# Patient Record
Sex: Female | Born: 1995 | Race: Black or African American | Hispanic: No | Marital: Single | Smoking: Never smoker
Health system: Southern US, Community
[De-identification: ages and names within clinical notes are randomized; demographics above are authoritative.]

## PROBLEM LIST (undated history)

## (undated) ENCOUNTER — Inpatient Hospital Stay (HOSPITAL_COMMUNITY): Payer: Self-pay

## (undated) DIAGNOSIS — Z789 Other specified health status: Secondary | ICD-10-CM

## (undated) DIAGNOSIS — G43909 Migraine, unspecified, not intractable, without status migrainosus: Secondary | ICD-10-CM

## (undated) HISTORY — PX: UMBILICAL HERNIA REPAIR: SHX196

## (undated) HISTORY — PX: HERNIA REPAIR: SHX51

---
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
## 2015-02-04 ENCOUNTER — Encounter (HOSPITAL_BASED_OUTPATIENT_CLINIC_OR_DEPARTMENT_OTHER): Payer: Self-pay | Admitting: Emergency Medicine

## 2015-02-04 ENCOUNTER — Emergency Department (HOSPITAL_BASED_OUTPATIENT_CLINIC_OR_DEPARTMENT_OTHER)
Admission: EM | Admit: 2015-02-04 | Discharge: 2015-02-04 | Disposition: A | Discharge status: Home / Self Care | Payer: 59 | Attending: Emergency Medicine | Admitting: Emergency Medicine

## 2015-02-04 ENCOUNTER — Emergency Department (HOSPITAL_BASED_OUTPATIENT_CLINIC_OR_DEPARTMENT_OTHER): Payer: 59

## 2015-02-04 DIAGNOSIS — N8320 Unspecified ovarian cysts: Secondary | ICD-10-CM | POA: Diagnosis not present

## 2015-02-04 DIAGNOSIS — K529 Noninfective gastroenteritis and colitis, unspecified: Secondary | ICD-10-CM | POA: Diagnosis not present

## 2015-02-04 DIAGNOSIS — Z3202 Encounter for pregnancy test, result negative: Secondary | ICD-10-CM | POA: Insufficient documentation

## 2015-02-04 DIAGNOSIS — R1084 Generalized abdominal pain: Secondary | ICD-10-CM | POA: Diagnosis present

## 2015-02-04 DIAGNOSIS — R109 Unspecified abdominal pain: Secondary | ICD-10-CM

## 2015-02-04 DIAGNOSIS — N83209 Unspecified ovarian cyst, unspecified side: Secondary | ICD-10-CM

## 2015-02-04 LAB — CBC WITH DIFFERENTIAL/PLATELET
Basophils Absolute: 0 10*3/uL (ref 0.0–0.1)
Basophils Relative: 0 % (ref 0–1)
Eosinophils Absolute: 0.1 10*3/uL (ref 0.0–0.7)
Eosinophils Relative: 1 % (ref 0–5)
HCT: 40 % (ref 36.0–46.0)
Hemoglobin: 12.9 g/dL (ref 12.0–15.0)
Lymphocytes Relative: 7 % — ABNORMAL LOW (ref 12–46)
Lymphs Abs: 0.7 10*3/uL (ref 0.7–4.0)
MCH: 27.3 pg (ref 26.0–34.0)
MCHC: 32.3 g/dL (ref 30.0–36.0)
MCV: 84.7 fL (ref 78.0–100.0)
Monocytes Absolute: 0.7 10*3/uL (ref 0.1–1.0)
Monocytes Relative: 6 % (ref 3–12)
Neutro Abs: 9 10*3/uL — ABNORMAL HIGH (ref 1.7–7.7)
Neutrophils Relative %: 86 % — ABNORMAL HIGH (ref 43–77)
Platelets: 345 10*3/uL (ref 150–400)
RBC: 4.72 MIL/uL (ref 3.87–5.11)
RDW: 14.2 % (ref 11.5–15.5)
WBC: 10.5 10*3/uL (ref 4.0–10.5)

## 2015-02-04 LAB — COMPREHENSIVE METABOLIC PANEL
ALT: 30 U/L (ref 14–54)
ANION GAP: 11 (ref 5–15)
AST: 40 U/L (ref 15–41)
Albumin: 4.5 g/dL (ref 3.5–5.0)
Alkaline Phosphatase: 73 U/L (ref 38–126)
BILIRUBIN TOTAL: 0.6 mg/dL (ref 0.3–1.2)
BUN: 12 mg/dL (ref 6–20)
CO2: 21 mmol/L — ABNORMAL LOW (ref 22–32)
Calcium: 9.5 mg/dL (ref 8.9–10.3)
Chloride: 105 mmol/L (ref 101–111)
Creatinine, Ser: 0.64 mg/dL (ref 0.44–1.00)
GFR calc Af Amer: >60 mL/min (ref >60)
GFR calc non Af Amer: >60 mL/min (ref >60)
Glucose, Bld: 97 mg/dL (ref 65–99)
Potassium: 3.6 mmol/L (ref 3.5–5.1)
Sodium: 137 mmol/L (ref 135–145)
Total Protein: 8.5 g/dL — ABNORMAL HIGH (ref 6.5–8.1)

## 2015-02-04 LAB — LIPASE, BLOOD: Lipase: 29 U/L (ref 22–51)

## 2015-02-04 LAB — URINALYSIS, ROUTINE W REFLEX MICROSCOPIC
GLUCOSE, UA: NEGATIVE mg/dL
Hgb urine dipstick: NEGATIVE
Ketones, ur: 15 mg/dL — AB
Nitrite: NEGATIVE
Protein, ur: NEGATIVE mg/dL
Specific Gravity, Urine: 1.029 (ref 1.005–1.030)
Urobilinogen, UA: 1 mg/dL (ref 0.0–1.0)
pH: 5.5 (ref 5.0–8.0)

## 2015-02-04 LAB — URINE MICROSCOPIC-ADD ON

## 2015-02-04 LAB — PREGNANCY, URINE: Preg Test, Ur: NEGATIVE

## 2015-02-04 MED ORDER — ONDANSETRON HCL 4 MG/2ML IJ SOLN
4.0000 mg | Freq: Once | INTRAMUSCULAR | Status: DC
  Filled 2015-02-04: qty 2

## 2015-02-04 MED ORDER — ONDANSETRON HCL 4 MG/2ML IJ SOLN
4.0000 mg | Freq: Once | INTRAMUSCULAR | Status: AC
  Administered 2015-02-04: 4 mg via INTRAVENOUS
  Filled 2015-02-04: qty 2

## 2015-02-04 MED ORDER — GI COCKTAIL ~~LOC~~
30.0000 mL | Freq: Once | ORAL | Status: AC
  Administered 2015-02-04: 30 mL via ORAL
  Filled 2015-02-04: qty 30

## 2015-02-04 MED ORDER — SODIUM CHLORIDE 0.9 % IV BOLUS (SEPSIS)
1000.0000 mL | Freq: Once | INTRAVENOUS | Status: AC
  Administered 2015-02-04: 1000 mL via INTRAVENOUS

## 2015-02-04 MED ORDER — ONDANSETRON HCL 4 MG PO TABS: 4.0000 mg | ORAL_TABLET | Freq: Three times a day (TID) | ORAL | Status: DC | PRN

## 2015-02-04 MED ORDER — MORPHINE SULFATE 4 MG/ML IJ SOLN
4.0000 mg | Freq: Once | INTRAMUSCULAR | Status: AC
  Administered 2015-02-04: 4 mg via INTRAVENOUS
  Filled 2015-02-04: qty 1

## 2015-02-04 MED ORDER — IOHEXOL 300 MG/ML  SOLN
25.0000 mL | Freq: Once | INTRAMUSCULAR | Status: AC | PRN
  Administered 2015-02-04: 25 mL via ORAL

## 2015-02-04 MED ORDER — IOHEXOL 300 MG/ML  SOLN
100.0000 mL | Freq: Once | INTRAMUSCULAR | Status: AC | PRN
  Administered 2015-02-04: 100 mL via INTRAVENOUS

## 2015-02-04 NOTE — ED Provider Notes (Signed)
CSN: [id number]     Arrival date & time 02/04/15  [id number] History   First MD Initiated Contact with Patient 02/04/15 0940     Chief Complaint  Patient presents with  . Abdominal Pain     (Consider location/radiation/quality/duration/timing/severity/associated sxs/prior Treatment) Patient is a 19 y.o. female presenting with abdominal pain.  Abdominal Pain Pain location:  Generalized Pain quality: aching   Pain radiates to:  Does not radiate Pain severity:  Severe Onset quality:  Gradual Duration:  4 days Timing:  Constant Progression:  Worsening Context: not sick contacts   Relieved by:  Nothing Worsened by:  Palpation Associated symptoms: diarrhea, nausea and vomiting   Associated symptoms: no dysuria, no fever and no hematuria     No past medical history on file. Past Surgical History  Procedure Laterality Date  . Umbilical hernia repair     Family History  Problem Relation Age of Onset  . Heart attack Father   . Cancer Father     stomach  . Cancer Other     lymphoma  . Diabetes Other    History  Substance Use Topics  . Smoking status: Never Smoker   . Smokeless tobacco: Never Used  . Alcohol Use: No   OB History    No data available     Review of Systems  Constitutional: Negative for fever.  Gastrointestinal: Positive for nausea, vomiting, abdominal pain and diarrhea.  Genitourinary: Negative for dysuria and hematuria.  All other systems reviewed and are negative.     Allergies  Review of patient's allergies indicates no known allergies.  Home Medications   Prior to Admission medications   Not on File   BP 112/70 mmHg  Pulse 85  Temp(Src) 98.6 F (37 C) (Oral)  Resp 16  Ht 5' 8.5" (1.74 m)  Wt 250 lb (113.399 kg)  BMI 37.46 kg/m2  SpO2 100%  LMP 01/19/2015 Physical Exam  Constitutional: She is oriented to person, place, and time. She appears well-developed and well-nourished. No distress.  HENT:  Head: Normocephalic and atraumatic.   Mouth/Throat: Oropharynx is clear and moist.  Eyes: Conjunctivae are normal. Pupils are equal, round, and reactive to light. No scleral icterus.  Neck: Neck supple.  Cardiovascular: Normal rate, regular rhythm, normal heart sounds and intact distal pulses.   No murmur heard. Pulmonary/Chest: Effort normal and breath sounds normal. No stridor. No respiratory distress. She has no rales.  Abdominal: Soft. Bowel sounds are normal. She exhibits no distension. There is generalized tenderness (worst LUQ). There is no rigidity, no rebound, no guarding and no CVA tenderness.  Musculoskeletal: Normal range of motion.  Neurological: She is alert and oriented to person, place, and time.  Skin: Skin is warm and dry. No rash noted.  Psychiatric: She has a normal mood and affect. Her behavior is normal.  Nursing note and vitals reviewed.   ED Course  Procedures (including critical care time) Labs Review Labs Reviewed  URINALYSIS, ROUTINE W REFLEX MICROSCOPIC - Abnormal; Notable for the following:    APPearance CLOUDY (*)    Bilirubin Urine SMALL (*)    Ketones, ur 15 (*)    Leukocytes, UA SMALL (*)    All other components within normal limits  URINE MICROSCOPIC-ADD ON - Abnormal; Notable for the following:    Squamous Epithelial / LPF FEW (*)    Bacteria, UA MANY (*)    All other components within normal limits  CBC WITH DIFFERENTIAL/PLATELET - Abnormal; Notable for the following:  Neutrophils Relative % 86 (*)    Neutro Abs 9.0 (*)    Lymphocytes Relative 7 (*)    All other components within normal limits  COMPREHENSIVE METABOLIC PANEL - Abnormal; Notable for the following:    CO2 21 (*)    Total Protein 8.5 (*)    All other components within normal limits  URINE CULTURE  PREGNANCY, URINE  LIPASE, BLOOD    Imaging Review Ct Abdomen Pelvis W Contrast  02/04/2015   CLINICAL DATA:  Abdominal pain, nausea and vomiting starting Friday  EXAM: CT ABDOMEN AND PELVIS WITH CONTRAST   TECHNIQUE: Multidetector CT imaging of the abdomen and pelvis was performed using the standard protocol following bolus administration of intravenous contrast.  CONTRAST:  25mL OMNIPAQUE IOHEXOL 300 MG/ML SOLN, 100mL OMNIPAQUE IOHEXOL 300 MG/ML SOLN  COMPARISON:  None.  FINDINGS: The lung bases are unremarkable. Sagittal images of the spine are unremarkable. Mild hepatic fatty infiltration. No focal hepatic mass. Enhanced pancreas, spleen and adrenal glands are unremarkable. Abdominal aorta is unremarkable. Enhanced kidneys are symmetrical in size. No hydronephrosis or hydroureter. Some liquid stool and gas noted in right colon and transverse colon. Diarrhea cannot be excluded. Normal retrocecal appendix. The terminal ileum is unremarkable. Mild fluid distended distal small bowel loops suspicious for enteritis. There is no transition point in small bowel caliber. No air-fluid levels. No evidence of small bowel obstruction.  Some stool noted in descending colon. Colonic gas noted in collapsed sigmoid colon. The uterus is unremarkable. A hemorrhagic follicle is noted within left ovary measures 1.4 cm. Small free fluid noted within posterior cul-de-sac. The urinary bladder is unremarkable. No destructive bony lesions are noted within pelvis. No inguinal adenopathy.  IMPRESSION: 1. There is some liquid stool and gas in right colon and transverse colon. Diarrhea cannot be excluded. Mild fluid distended distal small bowel loops suspicious for enteritis. No evidence of small bowel obstruction. No small bowel air-fluid levels. 2. Normal appendix.  No pericecal inflammation. 3. No hydronephrosis or hydroureter. 4. There is a hemorrhagic follicle within left ovary measures 1.4 cm. Small amount of free fluid noted within posterior pelvis.   Electronically Signed   By: Natasha MeadLiviu  Pop M.D.   On: 02/04/2015 11:03     EKG Interpretation None      MDM   Final diagnoses:  Abdominal pain  Enteritis  Hemorrhagic ovarian cyst     19 yo female with vomiting, diarrhea, and left sided abdominal pain.  Well appearing, nontoxic.  CT shows evidence of enteritis and of left ovarian hemorrhagic follicle.  I suspect symptoms are related to both.  Plan dc with zofran, advised fluids and supportive treatment.        Blake DivineJohn Maveryk Renstrom, MD 02/04/15 [phone]

## 2015-02-04 NOTE — Discharge Instructions (Signed)
Abdominal Pain, Women °Abdominal (stomach, pelvic, or belly) pain can be caused by many things. It is important to tell your doctor: °· The location of the pain. °· Does it come and go or is it present all the time? °· Are there things that start the pain (eating certain foods, exercise)? °· Are there other symptoms associated with the pain (fever, nausea, vomiting, diarrhea)? °All of this is helpful to know when trying to find the cause of the pain. °CAUSES  °· Stomach: virus or bacteria infection, or ulcer. °· Intestine: appendicitis (inflamed appendix), regional ileitis (Crohn's disease), ulcerative colitis (inflamed colon), irritable bowel syndrome, diverticulitis (inflamed diverticulum of the colon), or cancer of the stomach or intestine. °· Gallbladder disease or stones in the gallbladder. °· Kidney disease, kidney stones, or infection. °· Pancreas infection or cancer. °· Fibromyalgia (pain disorder). °· Diseases of the female organs: °¨ Uterus: fibroid (non-cancerous) tumors or infection. °¨ Fallopian tubes: infection or tubal pregnancy. °¨ Ovary: cysts or tumors. °¨ Pelvic adhesions (scar tissue). °¨ Endometriosis (uterus lining tissue growing in the pelvis and on the pelvic organs). °¨ Pelvic congestion syndrome (female organs filling up with blood just before the menstrual period). °¨ Pain with the menstrual period. °¨ Pain with ovulation (producing an egg). °¨ Pain with an IUD (intrauterine device, birth control) in the uterus. °¨ Cancer of the female organs. °· Functional pain (pain not caused by a disease, may improve without treatment). °· Psychological pain. °· Depression. °DIAGNOSIS  °Your doctor will decide the seriousness of your pain by doing an examination. °· Blood tests. °· X-rays. °· Ultrasound. °· CT scan (computed tomography, special type of X-ray). °· MRI (magnetic resonance imaging). °· Cultures, for infection. °· Barium enema (dye inserted in the large intestine, to better view it with  X-rays). °· Colonoscopy (looking in intestine with a lighted tube). °· Laparoscopy (minor surgery, looking in abdomen with a lighted tube). °· Major abdominal exploratory surgery (looking in abdomen with a large incision). °TREATMENT  °The treatment will depend on the cause of the pain.  °· Many cases can be observed and treated at home. °· Over-the-counter medicines recommended by your caregiver. °· Prescription medicine. °· Antibiotics, for infection. °· Birth control pills, for painful periods or for ovulation pain. °· Hormone treatment, for endometriosis. °· Nerve blocking injections. °· Physical therapy. °· Antidepressants. °· Counseling with a psychologist or psychiatrist. °· Minor or major surgery. °HOME CARE INSTRUCTIONS  °· Do not take laxatives, unless directed by your caregiver. °· Take over-the-counter pain medicine only if ordered by your caregiver. Do not take aspirin because it can cause an upset stomach or bleeding. °· Try a clear liquid diet (broth or water) as ordered by your caregiver. Slowly move to a bland diet, as tolerated, if the pain is related to the stomach or intestine. °· Have a thermometer and take your temperature several times a day, and record it. °· Bed rest and sleep, if it helps the pain. °· Avoid sexual intercourse, if it causes pain. °· Avoid stressful situations. °· Keep your follow-up appointments and tests, as your caregiver orders. °· If the pain does not go away with medicine or surgery, you may try: °¨ Acupuncture. °¨ Relaxation exercises (yoga, meditation). °¨ Group therapy. °¨ Counseling. °SEEK MEDICAL CARE IF:  °· You notice certain foods cause stomach pain. °· Your home care treatment is not helping your pain. °· You need stronger pain medicine. °· You want your IUD removed. °· You feel faint or   lightheaded. °· You develop nausea and vomiting. °· You develop a rash. °· You are having side effects or an allergy to your medicine. °SEEK IMMEDIATE MEDICAL CARE IF:  °· Your  pain does not go away or gets worse. °· You have a fever. °· Your pain is felt only in portions of the abdomen. The right side could possibly be appendicitis. The left lower portion of the abdomen could be colitis or diverticulitis. °· You are passing blood in your stools (bright red or black tarry stools, with or without vomiting). °· You have blood in your urine. °· You develop chills, with or without a fever. °· You pass out. °MAKE SURE YOU:  °· Understand these instructions. °· Will watch your condition. °· Will get help right away if you are not doing well or get worse. °Document Released: 07/04/2007 Document Revised: 01/21/2014 Document Reviewed: 07/24/2009 °[employer]® Patient Information ©2015 [employer]. This information is not intended to replace advice given to you by your health care provider. Make sure you discuss any questions you have with your health care provider. °Viral Gastroenteritis °Viral gastroenteritis is also known as stomach flu. This condition affects the stomach and intestinal tract. It can cause sudden diarrhea and vomiting. The illness typically lasts 3 to 8 days. Most people develop an immune response that eventually gets rid of the virus. While this natural response develops, the virus can make you quite ill. °CAUSES  °Many different viruses can cause gastroenteritis, such as rotavirus or noroviruses. You can catch one of these viruses by consuming contaminated food or water. You may also catch a virus by sharing utensils or other personal items with an infected person or by touching a contaminated surface. °SYMPTOMS  °The most common symptoms are diarrhea and vomiting. These problems can cause a severe loss of body fluids (dehydration) and a body salt (electrolyte) imbalance. Other symptoms may include: °· Fever. °· Headache. °· Fatigue. °· Abdominal pain. °DIAGNOSIS  °Your caregiver can usually diagnose viral gastroenteritis based on your symptoms and a physical exam. A stool  sample may also be taken to test for the presence of viruses or other infections. °TREATMENT  °This illness typically goes away on its own. Treatments are aimed at rehydration. The most serious cases of viral gastroenteritis involve vomiting so severely that you are not able to keep fluids down. In these cases, fluids must be given through an intravenous line (IV). °HOME CARE INSTRUCTIONS  °· Drink enough fluids to keep your urine clear or pale yellow. Drink small amounts of fluids frequently and increase the amounts as tolerated. °· Ask your caregiver for specific rehydration instructions. °· Avoid: °¨ Foods high in sugar. °¨ Alcohol. °¨ Carbonated drinks. °¨ Tobacco. °¨ Juice. °¨ Caffeine drinks. °¨ Extremely hot or cold fluids. °¨ Fatty, greasy foods. °¨ Too much intake of anything at one time. °¨ Dairy products until 24 to 48 hours after diarrhea stops. °· You may consume probiotics. Probiotics are active cultures of beneficial bacteria. They may lessen the amount and number of diarrheal stools in adults. Probiotics can be found in yogurt with active cultures and in supplements. °· Wash your hands well to avoid spreading the virus. °· Only take over-the-counter or prescription medicines for pain, discomfort, or fever as directed by your caregiver. Do not give aspirin to children. Antidiarrheal medicines are not recommended. °· Ask your caregiver if you should continue to take your regular prescribed and over-the-counter medicines. °· Keep all follow-up appointments as directed by your caregiver. °  SEEK IMMEDIATE MEDICAL CARE IF:  °· You are unable to keep fluids down. °· You do not urinate at least once every 6 to 8 hours. °· You develop shortness of breath. °· You notice blood in your stool or vomit. This may look like coffee grounds. °· You have abdominal pain that increases or is concentrated in one small area (localized). °· You have persistent vomiting or diarrhea. °· You have a fever. °· The patient is a  child younger than 3 months, and he or she has a fever. °· The patient is a child older than 3 months, and he or she has a fever and persistent symptoms. °· The patient is a child older than 3 months, and he or she has a fever and symptoms suddenly get worse. °· The patient is a baby, and he or she has no tears when crying. °MAKE SURE YOU:  °· Understand these instructions. °· Will watch your condition. °· Will get help right away if you are not doing well or get worse. °Document Released: 09/06/2005 Document Revised: 11/29/2011 Document Reviewed: 06/23/2011 °ExitCare® Patient Information ©2015 [employer]. This information is not intended to replace advice given to you by your health care provider. Make sure you discuss any questions you have with your health care provider. ° °

## 2015-02-04 NOTE — ED Notes (Signed)
Abdominal pain with N/V/D since Friday.  No fever.  No recent exposure to illness.

## 2015-02-04 NOTE — ED Notes (Addendum)
Pt c/o not being able to breathe. Pt lungs clear and throat clear. Pt hyperventilating. Instructed to slow breathing down. Sat 100%. Pt wants something to drink. Pt given a few ice chips. Pt calmed down. Pt refused zofran.

## 2015-02-04 NOTE — ED Notes (Signed)
Family x 2 in to sit with pt.

## 2015-02-05 LAB — URINE CULTURE

## 2016-02-04 ENCOUNTER — Telehealth (HOSPITAL_COMMUNITY): Payer: Self-pay | Admitting: Psychiatry

## 2016-02-04 NOTE — Telephone Encounter (Signed)
Records request approved

## 2017-07-26 ENCOUNTER — Other Ambulatory Visit: Payer: Self-pay

## 2017-07-26 ENCOUNTER — Encounter (HOSPITAL_BASED_OUTPATIENT_CLINIC_OR_DEPARTMENT_OTHER): Payer: Self-pay | Admitting: *Deleted

## 2017-07-26 ENCOUNTER — Emergency Department (HOSPITAL_BASED_OUTPATIENT_CLINIC_OR_DEPARTMENT_OTHER)
Admission: EM | Admit: 2017-07-26 | Discharge: 2017-07-26 | Disposition: A | Discharge status: Home / Self Care | Payer: 59 | Attending: Emergency Medicine | Admitting: Emergency Medicine

## 2017-07-26 DIAGNOSIS — R070 Pain in throat: Secondary | ICD-10-CM | POA: Diagnosis present

## 2017-07-26 DIAGNOSIS — J020 Streptococcal pharyngitis: Secondary | ICD-10-CM | POA: Diagnosis not present

## 2017-07-26 LAB — RAPID STREP SCREEN (MED CTR MEBANE ONLY): Streptococcus, Group A Screen (Direct): POSITIVE — AB

## 2017-07-26 MED ORDER — ACETAMINOPHEN 325 MG PO TABS
650.0000 mg | ORAL_TABLET | Freq: Once | ORAL | Status: AC
  Administered 2017-07-26: 650 mg via ORAL
  Filled 2017-07-26: qty 2

## 2017-07-26 MED ORDER — DEXAMETHASONE 10 MG/ML FOR PEDIATRIC ORAL USE
10.0000 mg | Freq: Once | INTRAMUSCULAR | Status: AC
  Administered 2017-07-26: 10 mg via ORAL
  Filled 2017-07-26: qty 1

## 2017-07-26 MED ORDER — AMOXICILLIN 500 MG PO CAPS: 500.0000 mg | ORAL_CAPSULE | Freq: Two times a day (BID) | ORAL | 0 refills | Status: DC

## 2017-07-26 NOTE — Discharge Instructions (Signed)
You have been diagnosed with strep throat. Use the amoxicillin  for 10 full days. It is very important that  you or your child complete the entire course of this medication or the strep may not completely be treated.  Also discard  you or your child's toothbrush and begin using a new one in 3 days. For sore throat, take ibuprofen or tylenol every 6hr as needed. Follow up with your doctor in 2-3 days if no improvement. Return to the ED sooner for worsening condition, inability to swallow, breathing difficulty, new concerns. ° ° °

## 2017-07-26 NOTE — ED Notes (Signed)
Pt verbalizes understanding of d/c instructions and denies any further needs at this time. 

## 2017-07-26 NOTE — ED Triage Notes (Signed)
Sore throat today.

## 2017-07-27 NOTE — ED Provider Notes (Signed)
[facility] EMERGENCY DEPARTMENT Provider Note   CSN: [id number] Arrival date & time: 07/26/17  2204     History   Chief Complaint Chief Complaint  Patient presents with  . Sore Throat    HPI [name] is a 21 y.o. female.  HPI 21 year old African-American female presents to the ED for evaluation of sore throat.  Sore throat started this morning.  Hurts to swallow.  Denies any associated rhinorrhea, otalgia, cough, fevers, chills.  Denies any known sick contacts.  Has tried nothing for pain prior to arrival.  Nothing makes better. History reviewed. No pertinent past medical history.  There are no active problems to display for this patient.   Past Surgical History:  Procedure Laterality Date  . UMBILICAL HERNIA REPAIR      OB History    No data available       Home Medications    Prior to Admission medications   Medication Sig Start Date End Date Taking? Authorizing Provider  amoxicillin (AMOXIL) 500 MG capsule Take 1 capsule (500 mg total) 2 (two) times daily by mouth. 07/26/17   Rise MuLeaphart, Amiere Cawley T, PA-C  ondansetron (ZOFRAN) 4 MG tablet Take 1 tablet (4 mg total) by mouth every 8 (eight) hours as needed for nausea or vomiting. 02/04/15   Blake DivineWofford, John, MD    Family History Family History  Problem Relation Age of Onset  . Heart attack Father   . Cancer Father        stomach  . Cancer Other        lymphoma  . Diabetes Other     Social History Social History   Tobacco Use  . Smoking status: Never Smoker  . Smokeless tobacco: Never Used  Substance Use Topics  . Alcohol use: No  . Drug use: No     Allergies   Patient has no known allergies.   Review of Systems Review of Systems  Constitutional: Negative for chills and fever.  HENT: Positive for sore throat and trouble swallowing. Negative for congestion and rhinorrhea.   Respiratory: Negative for cough.   Gastrointestinal: Negative for vomiting.     Physical Exam Updated  Vital Signs BP 121/68   Pulse 84   Temp 98.6 F (37 C) (Oral)   Resp 18   Ht 5\' 8"  (1.727 m)   Wt 84.8 kg (187 lb)   LMP 07/10/2017   SpO2 100%   BMI 28.43 kg/m   Physical Exam  Constitutional: She appears well-developed and well-nourished. No distress.  HENT:  Head: Normocephalic and atraumatic.  Right Ear: Hearing, tympanic membrane and ear canal normal.  Left Ear: Hearing, tympanic membrane and ear canal normal.  Mouth/Throat: Uvula is midline and mucous membranes are normal. No uvula swelling. Oropharyngeal exudate, posterior oropharyngeal edema and posterior oropharyngeal erythema present. No tonsillar abscesses. Tonsils are 1+ on the right. Tonsils are 1+ on the left. Tonsillar exudate.  Eyes: Right eye exhibits no discharge. Left eye exhibits no discharge. No scleral icterus.  Neck: Normal range of motion.  Pulmonary/Chest: Effort normal. No stridor. No respiratory distress. She has no wheezes. She has no rhonchi. She has no rales. She exhibits no tenderness.  Musculoskeletal: Normal range of motion.  Neurological: She is alert.  Skin: Skin is warm and dry. Capillary refill takes less than 2 seconds. No pallor.  Psychiatric: Her behavior is normal. Judgment and thought content normal.  Nursing note and vitals reviewed.    ED Treatments / Results  Labs (all  labs ordered are listed, but only abnormal results are displayed) Labs Reviewed  RAPID STREP SCREEN (NOT AT [facility]) - Abnormal; Notable for the following components:      Result Value   Streptococcus, Group A Screen (Direct) POSITIVE (*)    All other components within normal limits    EKG  EKG Interpretation None       Radiology No results found.  Procedures Procedures (including critical care time)  Medications Ordered in ED Medications  acetaminophen (TYLENOL) tablet 650 mg (650 mg Oral Given 07/26/17 2253)  dexamethasone (DECADRON) 10 MG/ML injection for Pediatric ORAL use 10 mg (10 mg Oral Given  07/26/17 2253)     Initial Impression / Assessment and Plan / ED Course  I have reviewed the triage vital signs and the nursing notes.  Pertinent labs & imaging results that were available during my care of the patient were reviewed by me and considered in my medical decision making (see chart for details).     Pt febrile with tonsillar exudate, cervical lymphadenopathy, & dysphagia; diagnosis of strep. Treated in the Ed with steroids, NSAIDs, Pain medication and amox.  Pt appears mildly dehydrated, discussed importance of water rehydration. Presentation non concerning for PTA or infxn spread to soft tissue. No trismus or uvula deviation. Specific return precautions discussed. Pt able to drink water in ED without difficulty with intact air way. Recommended PCP follow up.    Final Clinical Impressions(s) / ED Diagnoses   Final diagnoses:  Strep throat    ED Discharge Orders        Ordered    amoxicillin (AMOXIL) 500 MG capsule  2 times daily     07/26/17 2303       Rise MuLeaphart, Adler Chartrand T, PA-C 07/27/17 1430    Long, Arlyss RepressJoshua G, MD 07/28/17 1407

## 2017-09-20 NOTE — L&D Delivery Note (Signed)
Delivery Note At 3:43 PM a viable female was delivered via Vaginal, Spontaneous (Presentation: vertex;  LOA).  APGAR: 8, 9; weight  .  6 lb 0.3 oz Placenta status:normal ,intact .  Cord: 3 vessel with the following complications: none.  Cord pH: not sent  Anesthesia:  epidural Episiotomy: None Lacerations: 1st degree;Vaginal Suture Repair: none Est. Blood Loss (mL): 300  Mom to postpartum.  Baby to NICU.  Scheryl Darter 05/21/2018, 4:02 PM

## 2017-12-23 LAB — OB RESULTS CONSOLE HEPATITIS B SURFACE ANTIGEN: Hepatitis B Surface Ag: NEGATIVE

## 2017-12-23 LAB — OB RESULTS CONSOLE RPR: RPR: NONREACTIVE

## 2017-12-23 LAB — OB RESULTS CONSOLE PLATELET COUNT: Platelets: 370

## 2017-12-23 LAB — OB RESULTS CONSOLE HGB/HCT, BLOOD
HCT: 37
Hemoglobin: 12.2

## 2017-12-23 LAB — OB RESULTS CONSOLE ABO/RH: RH Type: NEGATIVE

## 2017-12-23 LAB — OB RESULTS CONSOLE RUBELLA ANTIBODY, IGM: RUBELLA: IMMUNE

## 2017-12-23 LAB — HIV ANTIBODY (ROUTINE TESTING W REFLEX): HIV Screen 4th Generation wRfx: NONREACTIVE

## 2017-12-23 LAB — OB RESULTS CONSOLE ANTIBODY SCREEN: Antibody Screen: NEGATIVE

## 2018-01-30 ENCOUNTER — Inpatient Hospital Stay (HOSPITAL_COMMUNITY)
Admission: AD | Admit: 2018-01-30 | Discharge: 2018-01-31 | Disposition: A | Discharge status: Home / Self Care | Payer: 59 | Source: Ambulatory Visit | Attending: Obstetrics and Gynecology | Admitting: Obstetrics and Gynecology

## 2018-01-30 ENCOUNTER — Encounter (HOSPITAL_COMMUNITY): Payer: Self-pay | Admitting: *Deleted

## 2018-01-30 ENCOUNTER — Inpatient Hospital Stay (HOSPITAL_COMMUNITY): Payer: 59

## 2018-01-30 DIAGNOSIS — O26892 Other specified pregnancy related conditions, second trimester: Secondary | ICD-10-CM | POA: Insufficient documentation

## 2018-01-30 DIAGNOSIS — B373 Candidiasis of vulva and vagina: Secondary | ICD-10-CM

## 2018-01-30 DIAGNOSIS — Z3A18 18 weeks gestation of pregnancy: Secondary | ICD-10-CM

## 2018-01-30 DIAGNOSIS — R109 Unspecified abdominal pain: Secondary | ICD-10-CM | POA: Diagnosis not present

## 2018-01-30 DIAGNOSIS — R102 Pelvic and perineal pain: Secondary | ICD-10-CM | POA: Diagnosis present

## 2018-01-30 DIAGNOSIS — Z79899 Other long term (current) drug therapy: Secondary | ICD-10-CM | POA: Insufficient documentation

## 2018-01-30 DIAGNOSIS — O26899 Other specified pregnancy related conditions, unspecified trimester: Secondary | ICD-10-CM

## 2018-01-30 DIAGNOSIS — N949 Unspecified condition associated with female genital organs and menstrual cycle: Secondary | ICD-10-CM

## 2018-01-30 DIAGNOSIS — R748 Abnormal levels of other serum enzymes: Secondary | ICD-10-CM | POA: Diagnosis not present

## 2018-01-30 DIAGNOSIS — O98812 Other maternal infectious and parasitic diseases complicating pregnancy, second trimester: Secondary | ICD-10-CM | POA: Diagnosis not present

## 2018-01-30 DIAGNOSIS — B3731 Acute candidiasis of vulva and vagina: Secondary | ICD-10-CM

## 2018-01-30 LAB — CBC
HEMATOCRIT: 36 % (ref 36.0–46.0)
Hemoglobin: 11.7 g/dL — ABNORMAL LOW (ref 12.0–15.0)
MCH: 27.7 pg (ref 26.0–34.0)
MCHC: 32.5 g/dL (ref 30.0–36.0)
MCV: 85.1 fL (ref 78.0–100.0)
PLATELETS: 322 10*3/uL (ref 150–400)
RBC: 4.23 MIL/uL (ref 3.87–5.11)
RDW: 13.8 % (ref 11.5–15.5)
WBC: 7.5 10*3/uL (ref 4.0–10.5)

## 2018-01-30 LAB — URINALYSIS, ROUTINE W REFLEX MICROSCOPIC
Bilirubin Urine: NEGATIVE
Glucose, UA: NEGATIVE mg/dL
Hgb urine dipstick: NEGATIVE
Ketones, ur: NEGATIVE mg/dL
Nitrite: NEGATIVE
Protein, ur: NEGATIVE mg/dL
Specific Gravity, Urine: 1.02 (ref 1.005–1.030)
pH: 6 (ref 5.0–8.0)

## 2018-01-30 LAB — COMPREHENSIVE METABOLIC PANEL
ALT: 57 U/L — ABNORMAL HIGH (ref 14–54)
AST: 47 U/L — ABNORMAL HIGH (ref 15–41)
Albumin: 3.4 g/dL — ABNORMAL LOW (ref 3.5–5.0)
Alkaline Phosphatase: 57 U/L (ref 38–126)
Anion gap: 9 (ref 5–15)
BILIRUBIN TOTAL: 0.2 mg/dL — AB (ref 0.3–1.2)
BUN: 9 mg/dL (ref 6–20)
CHLORIDE: 105 mmol/L (ref 101–111)
CO2: 22 mmol/L (ref 22–32)
Calcium: 9 mg/dL (ref 8.9–10.3)
Creatinine, Ser: 0.54 mg/dL (ref 0.44–1.00)
GFR calc non Af Amer: >60 mL/min (ref >60)
Glucose, Bld: 88 mg/dL (ref 65–99)
POTASSIUM: 3.9 mmol/L (ref 3.5–5.1)
Sodium: 136 mmol/L (ref 135–145)
Total Protein: 7.8 g/dL (ref 6.5–8.1)

## 2018-01-30 LAB — WET PREP, GENITAL
Clue Cells Wet Prep HPF POC: NONE SEEN
SPERM: NONE SEEN
TRICH WET PREP: NONE SEEN

## 2018-01-30 LAB — LIPASE, BLOOD: Lipase: 31 U/L (ref 11–51)

## 2018-01-30 MED ORDER — ACETAMINOPHEN 500 MG PO TABS
1000.0000 mg | ORAL_TABLET | Freq: Four times a day (QID) | ORAL | Status: DC | PRN
  Administered 2018-01-30: 1000 mg via ORAL
  Filled 2018-01-30: qty 2

## 2018-01-30 NOTE — MAU Provider Note (Signed)
History     CSN: [id number]  Arrival date and time: 01/30/18 2128   First Provider Initiated Contact with Patient 01/30/18 2159      Chief Complaint  Patient presents with  . Abdominal Pain   G1 .2 wks here with LAP and HA. She describes as bilateral pressure from her lower abdomen to her umbilicus. Started 2 weeks ago. Nothing makes it better or worse. She has not tried any OTCs. No VB or discharge. No fevers. No urinary sx. HA started this am. Located left frontal. No visual disturbances. Hasn't tried anything for it. Eating and drinking well today. She is transferring care from [facility] to [facility]. No prenatal records are on file.    OB History    Gravida  1   Para      Term      Preterm      AB      Living        SAB      TAB      Ectopic      Multiple      Live Births              History reviewed. No pertinent past medical history.  Past Surgical History:  Procedure Laterality Date  . UMBILICAL HERNIA REPAIR      Family History  Problem Relation Age of Onset  . Heart attack Father   . Cancer Father        stomach  . Cancer Other        lymphoma  . Diabetes Other     Social History   Tobacco Use  . Smoking status: Never Smoker  . Smokeless tobacco: Never Used  Substance Use Topics  . Alcohol use: No  . Drug use: No    Allergies: No Known Allergies  Medications Prior to Admission  Medication Sig Dispense Refill Last Dose  . amoxicillin (AMOXIL) 500 MG capsule Take 1 capsule (500 mg total) 2 (two) times daily by mouth. 20 capsule 0   . ondansetron (ZOFRAN) 4 MG tablet Take 1 tablet (4 mg total) by mouth every 8 (eight) hours as needed for nausea or vomiting. 12 tablet 0     Review of Systems  Constitutional: Negative for fever.  Eyes: Negative for visual disturbance.  Gastrointestinal: Positive for abdominal pain. Negative for constipation, diarrhea, nausea and vomiting.  Genitourinary: Negative for dysuria, hematuria,  urgency, vaginal bleeding and vaginal discharge.  Neurological: Positive for headaches.   Physical Exam   Blood pressure 119/69, pulse 70, temperature 98.8 F (37.1 C), temperature source Oral, resp. rate 16, last menstrual period 07/10/2017, SpO2 99 %.  Physical Exam  Nursing note and vitals reviewed. Constitutional: She is oriented to person, place, and time. She appears well-developed and well-nourished. No distress.  HENT:  Head: Normocephalic and atraumatic.  Eyes: Pupils are equal, round, and reactive to light. Conjunctivae and EOM are normal.  Neck: Normal range of motion.  Cardiovascular: Normal rate.  Respiratory: Effort normal. No respiratory distress.  GI: Soft. She exhibits no distension and no mass. There is tenderness in the suprapubic area. There is no rebound and no guarding.  Genitourinary:  Genitourinary Comments: External: no lesions or erythema Vagina: rugated, pink, moist, small curdy white discharge Cervix closed/long   Musculoskeletal: Normal range of motion.  Neurological: She is alert and oriented to person, place, and time.  Skin: Skin is warm and dry.  Psychiatric: She has a normal mood and affect.  FHT 140  Results for orders placed or performed during the hospital encounter of 01/30/18 (from the past 24 hour(s))  Urinalysis, Routine w reflex microscopic     Status: Abnormal   Collection Time: 01/30/18  9:35 PM  Result Value Ref Range   Color, Urine YELLOW YELLOW   APPearance HAZY (A) CLEAR   Specific Gravity, Urine 1.020 1.005 - 1.030   pH 6.0 5.0 - 8.0   Glucose, UA NEGATIVE NEGATIVE mg/dL   Hgb urine dipstick NEGATIVE NEGATIVE   Bilirubin Urine NEGATIVE NEGATIVE   Ketones, ur NEGATIVE NEGATIVE mg/dL   Protein, ur NEGATIVE NEGATIVE mg/dL   Nitrite NEGATIVE NEGATIVE   Leukocytes, UA SMALL (A) NEGATIVE   RBC / HPF 0-5 0 - 5 RBC/hpf   WBC, UA 6-10 0 - 5 WBC/hpf   Bacteria, UA RARE (A) NONE SEEN   Squamous Epithelial / LPF 0-5 0 - 5   Mucus  PRESENT   Wet prep, genital     Status: Abnormal   Collection Time: 01/30/18 10:10 PM  Result Value Ref Range   Yeast Wet Prep HPF POC PRESENT (A) NONE SEEN   Trich, Wet Prep NONE SEEN NONE SEEN   Clue Cells Wet Prep HPF POC NONE SEEN NONE SEEN   WBC, Wet Prep HPF POC MANY (A) NONE SEEN   Sperm NONE SEEN   CBC     Status: Abnormal   Collection Time: 01/30/18 10:26 PM  Result Value Ref Range   WBC 7.5 4.0 - 10.5 K/uL   RBC 4.23 3.87 - 5.11 MIL/uL   Hemoglobin 11.7 (L) 12.0 - 15.0 g/dL   HCT 16.1 09.6 - 04.5 %   MCV 85.1 78.0 - 100.0 fL   MCH 27.7 26.0 - 34.0 pg   MCHC 32.5 30.0 - 36.0 g/dL   RDW 40.9 81.1 - 91.4 %   Platelets 322 150 - 400 K/uL  Comprehensive metabolic panel     Status: Abnormal   Collection Time: 01/30/18 10:26 PM  Result Value Ref Range   Sodium 136 135 - 145 mmol/L   Potassium 3.9 3.5 - 5.1 mmol/L   Chloride 105 101 - 111 mmol/L   CO2 22 22 - 32 mmol/L   Glucose, Bld 88 65 - 99 mg/dL   BUN 9 6 - 20 mg/dL   Creatinine, Ser 7.82 0.44 - 1.00 mg/dL   Calcium 9.0 8.9 - 95.6 mg/dL   Total Protein 7.8 6.5 - 8.1 g/dL   Albumin 3.4 (L) 3.5 - 5.0 g/dL   AST 47 (H) 15 - 41 U/L   ALT 57 (H) 14 - 54 U/L   Alkaline Phosphatase 57 38 - 126 U/L   Total Bilirubin 0.2 (L) 0.3 - 1.2 mg/dL   GFR calc non Af Amer >60 >60 mL/min   GFR calc Af Amer >60 >60 mL/min   Anion gap 9 5 - 15  Lipase, blood     Status: None   Collection Time: 01/30/18 10:26 PM  Result Value Ref Range   Lipase 31 11 - 51 U/L   US Abdomen Limited Ruq  Result Date: 01/31/2018 CLINICAL DATA:  Abdominal pain, [redacted] weeks pregnant EXAM: ULTRASOUND ABDOMEN LIMITED RIGHT UPPER QUADRANT COMPARISON:  None. FINDINGS: Gallbladder: No gallstones or wall thickening visualized. No sonographic Murphy sign noted by sonographer. Gallbladder slightly contracted. Common bile duct: Diameter: 3.5 mm Liver: No focal lesion identified. Within normal limits in parenchymal echogenicity. Portal vein is patent on color Doppler  imaging with normal direction  of blood flow towards the liver. IMPRESSION: Contracted gallbladder. Otherwise negative right upper quadrant abdominal ultrasound Electronically Signed   By: Jasmine Pang M.D.   On: 01/31/2018 00:15   MAU Course  Procedures Tylenol  MDM Labs ordered and reviewed. No UTI or threatened SAB. Elevated LE>RUQ [place] ordered and nml. Pain somewhat improved. Likely MSK or RL pain. Discussed comfort measures. Stable for discharge home.   Assessment and Plan   1. [redacted] weeks gestation of pregnancy   2. Abdominal pain in pregnancy   3. Yeast vaginitis   4. Elevated liver enzymes   5. Round ligament pain    Discharge home Follow up at [facility] next week as scheduled Tylenol prn Heat prn comfort  Allergies as of 01/31/2018   No Known Allergies     Medication List    STOP taking these medications   amoxicillin 500 MG capsule Commonly known as:  AMOXIL     TAKE these medications   ondansetron 4 MG tablet Commonly known as:  ZOFRAN Take 1 tablet (4 mg total) by mouth every 8 (eight) hours as needed for nausea or vomiting.      Donette Larry, CNM 01/31/2018, 12:27 AM

## 2018-01-30 NOTE — MAU Note (Signed)
Pt states she has been having pressure in her lower abdomen up to her umbilicus for the last two weeks.  Nothing makes it better or worse.  No VB.  States she woke up with a headache and has had it all day has not taken anything for any of the pain.

## 2018-01-31 DIAGNOSIS — B373 Candidiasis of vulva and vagina: Secondary | ICD-10-CM

## 2018-01-31 DIAGNOSIS — R109 Unspecified abdominal pain: Secondary | ICD-10-CM

## 2018-01-31 DIAGNOSIS — Z3A18 18 weeks gestation of pregnancy: Secondary | ICD-10-CM | POA: Diagnosis not present

## 2018-01-31 DIAGNOSIS — O26892 Other specified pregnancy related conditions, second trimester: Secondary | ICD-10-CM

## 2018-01-31 LAB — GC/CHLAMYDIA PROBE AMP (~~LOC~~) NOT AT ARMC
CHLAMYDIA, DNA PROBE: NEGATIVE
Neisseria Gonorrhea: NEGATIVE

## 2018-01-31 NOTE — Discharge Instructions (Signed)
Round Ligament Pain The round ligament is a cord of muscle and tissue that helps to support the uterus. It can become a source of pain during pregnancy if it becomes stretched or twisted as the baby grows. The pain usually begins in the second trimester of pregnancy, and it can come and go until the baby is delivered. It is not a serious problem, and it does not cause harm to the baby. Round ligament pain is usually a short, sharp, and pinching pain, but it can also be a dull, lingering, and aching pain. The pain is felt in the lower side of the abdomen or in the groin. It usually starts deep in the groin and moves up to the outside of the hip area. Pain can occur with:  A sudden change in position.  Rolling over in bed.  Coughing or sneezing.  Physical activity.  Follow these instructions at home: Watch your condition for any changes. Take these steps to help with your pain:  When the pain starts, relax. Then try: ? Sitting down. ? Flexing your knees up to your abdomen. ? Lying on your side with one pillow under your abdomen and another pillow between your legs. ? Sitting in a warm bath for 15-20 minutes or until the pain goes away.  Take over-the-counter and prescription medicines only as told by your health care provider.  Move slowly when you sit and stand.  Avoid long walks if they cause pain.  Stop or lessen your physical activities if they cause pain.  Contact a health care provider if:  Your pain does not go away with treatment.  You feel pain in your back that you did not have before.  Your medicine is not helping. Get help right away if:  You develop a fever or chills.  You develop uterine contractions.  You develop vaginal bleeding.  You develop nausea or vomiting.  You develop diarrhea.  You have pain when you urinate. This information is not intended to replace advice given to you by your health care provider. Make sure you discuss any questions you have  with your health care provider. Document Released: 06/15/2008 Document Revised: 02/12/2016 Document Reviewed: 11/13/2014 Elsevier Interactive Patient Education  2018 [employer] Vaginal Yeast infection, Adult Vaginal yeast infection is a condition that causes soreness, swelling, and redness (inflammation) of the vagina. It also causes vaginal discharge. This is a common condition. Some women get this infection frequently. What are the causes? This condition is caused by a change in the normal balance of the yeast (candida) and bacteria that live in the vagina. This change causes an overgrowth of yeast, which causes the inflammation. What increases the risk? This condition is more likely to develop in:  Women who take antibiotic medicines.  Women who have diabetes.  Women who take birth control pills.  Women who are pregnant.  Women who douche often.  Women who have a weak defense (immune) system.  Women who have been taking steroid medicines for a long time.  Women who frequently wear tight clothing.  What are the signs or symptoms? Symptoms of this condition include:  White, thick vaginal discharge.  Swelling, itching, redness, and irritation of the vagina. The lips of the vagina (vulva) may be affected as well.  Pain or a burning feeling while urinating.  Pain during sex.  How is this diagnosed? This condition is diagnosed with a medical history and physical exam. This will include a pelvic exam. Your health care provider will  examine a sample of your vaginal discharge under a microscope. Your health care provider may send this sample for testing to confirm the diagnosis. °How is this treated? °This condition is treated with medicine. Medicines may be over-the-counter or prescription. You may be told to use one or more of the following: °· Medicine that is taken orally. °· Medicine that is applied as a cream. °· Medicine that is inserted directly into the vagina  (suppository). ° °Follow these instructions at home: °· Take or apply over-the-counter and prescription medicines only as told by your health care provider. °· Do not have sex until your health care provider has approved. Tell your sex partner that you have a yeast infection. That person should go to his or her health care provider if he or she develops symptoms. °· Do not wear tight clothes, such as pantyhose or tight pants. °· Avoid using tampons until your health care provider approves. °· Eat more yogurt. This may help to keep your yeast infection from returning. °· Try taking a sitz bath to help with discomfort. This is a warm water bath that is taken while you are sitting down. The water should only come up to your hips and should cover your buttocks. Do this 3-4 times per day or as told by your health care provider. °· Do not douche. °· Wear breathable, cotton underwear. °· If you have diabetes, keep your blood sugar levels under control. °Contact a health care provider if: °· You have a fever. °· Your symptoms go away and then return. °· Your symptoms do not get better with treatment. °· Your symptoms get worse. °· You have new symptoms. °· You develop blisters in or around your vagina. °· You have blood coming from your vagina and it is not your menstrual period. °· You develop pain in your abdomen. °This information is not intended to replace advice given to you by your health care provider. Make sure you discuss any questions you have with your health care provider. °Document Released: 06/16/2005 Document Revised: 02/18/2016 Document Reviewed: 03/10/2015 °Elsevier Interactive Patient Education © 2018 Elsevier Inc. ° °

## 2018-02-06 ENCOUNTER — Encounter: Payer: Self-pay | Admitting: Certified Nurse Midwife

## 2018-02-06 ENCOUNTER — Ambulatory Visit (INDEPENDENT_AMBULATORY_CARE_PROVIDER_SITE_OTHER): Payer: Medicaid Other | Admitting: Certified Nurse Midwife

## 2018-02-06 VITALS — BP 115/73 | HR 81 | Wt 231.0 lb

## 2018-02-06 DIAGNOSIS — Z3481 Encounter for supervision of other normal pregnancy, first trimester: Secondary | ICD-10-CM | POA: Diagnosis not present

## 2018-02-06 DIAGNOSIS — Z3402 Encounter for supervision of normal first pregnancy, second trimester: Secondary | ICD-10-CM

## 2018-02-06 DIAGNOSIS — Z8719 Personal history of other diseases of the digestive system: Secondary | ICD-10-CM | POA: Insufficient documentation

## 2018-02-06 DIAGNOSIS — Z3400 Encounter for supervision of normal first pregnancy, unspecified trimester: Secondary | ICD-10-CM | POA: Insufficient documentation

## 2018-02-06 DIAGNOSIS — Z9889 Other specified postprocedural states: Secondary | ICD-10-CM

## 2018-02-06 DIAGNOSIS — O26892 Other specified pregnancy related conditions, second trimester: Secondary | ICD-10-CM

## 2018-02-06 DIAGNOSIS — O26899 Other specified pregnancy related conditions, unspecified trimester: Secondary | ICD-10-CM

## 2018-02-06 DIAGNOSIS — Z6791 Unspecified blood type, Rh negative: Secondary | ICD-10-CM | POA: Insufficient documentation

## 2018-02-06 MED ORDER — PRENATE PIXIE 10-0.6-0.4-200 MG PO CAPS: 1.0000 | ORAL_CAPSULE | Freq: Every day | ORAL | 12 refills | Status: DC

## 2018-02-06 NOTE — Progress Notes (Signed)
Subjective:   [name] is a 22 y.o. G1P0 at [redacted]w[redacted]d by LMP being seen today for her first obstetrical visit.  Her obstetrical history is significant for none. Patient does intend to breast feed. Pregnancy history fully reviewed.  Patient reports no complaints.  HISTORY: OB History  Gravida Para Term Preterm AB Living  1 0 0 0 0 0  SAB TAB Ectopic Multiple Live Births  0 0 0 0 0    # Outcome Date GA Lbr Len/2nd Weight Sex Delivery Anes PTL Lv  1 Current             Last pap smear was done 11/2017 and was normal  History reviewed. No pertinent past medical history. Past Surgical History:  Procedure Laterality Date  . UMBILICAL HERNIA REPAIR     Family History  Problem Relation Age of Onset  . Heart attack Father   . Cancer Father        stomach  . Cancer Other        lymphoma  . Diabetes Other    Social History   Tobacco Use  . Smoking status: Never Smoker  . Smokeless tobacco: Never Used  Substance Use Topics  . Alcohol use: No  . Drug use: No   No Known Allergies Current Outpatient Medications on File Prior to Visit  Medication Sig Dispense Refill  . Prenatal Vit-Fe Fumarate-FA (MULTIVITAMIN-PRENATAL) 27-0.8 MG TABS tablet Take 1 tablet by mouth daily at 12 noon.    . ondansetron (ZOFRAN) 4 MG tablet Take 1 tablet (4 mg total) by mouth every 8 (eight) hours as needed for nausea or vomiting. 12 tablet 0   No current facility-administered medications on file prior to visit.     Review of Systems Pertinent items noted in HPI and remainder of comprehensive ROS otherwise negative.  Exam   Vitals:   02/06/18 1343  BP: 115/73  Pulse: 81  Weight: 231 lb (104.8 kg)   Fetal Heart Rate (bpm): 140; doppler  Uterus:  Fundal Height: 19 cm  Pelvic Exam: Perineum: deferred   Vulva: deferred   Vagina:  deferred   Cervix: deferred   Adnexa: deferred   Bony Pelvis: average  System: General: well-developed, well-nourished female in no acute distress   Breast:  normal appearance, no masses or tenderness   Skin: normal coloration and turgor, no rashes   Neurologic: oriented, normal, negative, normal mood   Extremities: normal strength, tone, and muscle mass, ROM of all joints is normal   HEENT PERRLA, extraocular movement intact and sclera clear, anicteric   Mouth/Teeth mucous membranes moist, pharynx normal without lesions and dental hygiene good   Neck supple and no masses   Cardiovascular: regular rate and rhythm   Respiratory:  no respiratory distress, normal breath sounds   Abdomen: soft, non-tender; bowel sounds normal; no masses,  no organomegaly     Assessment:   Pregnancy: G1P0 Patient Active Problem List   Diagnosis Date Noted  . Supervision of normal first pregnancy, antepartum 02/06/2018  . History of umbilical hernia repair 02/06/2018  . Rh negative state in antepartum period 02/06/2018     Plan:  1. Supervision of normal first pregnancy, antepartum     Doing well.  Transfer with records from [facility].   - Hemoglobin A1c - Inheritest Core(CF97,SMA,FraX) - [place] MFM OB COMP + 14 WK; Future - Enroll Patient in Babyscripts - Prenat-FeAsp-Meth-FA-DHA w/o A (PRENATE PIXIE) 10-0.6-0.4-200 MG CAPS; Take 1 tablet by  mouth daily.  Dispense: 30 capsule; Refill: 12  2. History of umbilical hernia repair     As a child  3. Rh negative state in antepartum period    Rhogam planned for 28 weeks.    Initial labs drawn. Continue prenatal vitamins. Genetic Screening discussed, Quad screen and NIPS: results reviewed. Ultrasound discussed; fetal anatomic survey: ordered. Problem list reviewed and updated. The nature of  - [facility] with multiple MDs and other Advanced Practice Providers was explained to patient; also emphasized that residents, students are part of our team. Routine obstetric precautions reviewed. Return in about 1 month (around 03/06/2018) for ROB.     Orvilla Cornwall, CNM [facility], [facility]

## 2018-02-07 LAB — HEMOGLOBIN A1C
Est. average glucose Bld gHb Est-mCnc: 100 mg/dL
Hgb A1c MFr Bld: 5.1 % (ref 4.8–5.6)

## 2018-02-14 ENCOUNTER — Encounter (HOSPITAL_COMMUNITY): Payer: Self-pay

## 2018-02-17 LAB — INHERITEST CORE(CF97,SMA,FRAX)

## 2018-02-20 ENCOUNTER — Other Ambulatory Visit: Payer: Self-pay | Admitting: Certified Nurse Midwife

## 2018-02-20 ENCOUNTER — Ambulatory Visit (HOSPITAL_COMMUNITY)
Admission: RE | Admit: 2018-02-20 | Discharge: 2018-02-20 | Disposition: A | Discharge status: Home / Self Care | Payer: 59 | Source: Ambulatory Visit | Attending: Certified Nurse Midwife | Admitting: Certified Nurse Midwife

## 2018-02-20 DIAGNOSIS — Z363 Encounter for antenatal screening for malformations: Secondary | ICD-10-CM | POA: Diagnosis not present

## 2018-02-20 DIAGNOSIS — Z3402 Encounter for supervision of normal first pregnancy, second trimester: Secondary | ICD-10-CM | POA: Diagnosis not present

## 2018-02-20 DIAGNOSIS — Z3A21 21 weeks gestation of pregnancy: Secondary | ICD-10-CM | POA: Diagnosis not present

## 2018-02-20 DIAGNOSIS — O99212 Obesity complicating pregnancy, second trimester: Secondary | ICD-10-CM

## 2018-02-20 DIAGNOSIS — Z3400 Encounter for supervision of normal first pregnancy, unspecified trimester: Secondary | ICD-10-CM

## 2018-02-21 ENCOUNTER — Other Ambulatory Visit: Payer: Self-pay | Admitting: Certified Nurse Midwife

## 2018-02-21 DIAGNOSIS — O99212 Obesity complicating pregnancy, second trimester: Secondary | ICD-10-CM | POA: Insufficient documentation

## 2018-02-21 DIAGNOSIS — Z3400 Encounter for supervision of normal first pregnancy, unspecified trimester: Secondary | ICD-10-CM

## 2018-02-21 DIAGNOSIS — Z363 Encounter for antenatal screening for malformations: Secondary | ICD-10-CM | POA: Insufficient documentation

## 2018-02-21 DIAGNOSIS — Z3A21 21 weeks gestation of pregnancy: Secondary | ICD-10-CM | POA: Insufficient documentation

## 2018-02-28 ENCOUNTER — Encounter: Payer: Self-pay | Admitting: *Deleted

## 2018-03-10 ENCOUNTER — Ambulatory Visit (INDEPENDENT_AMBULATORY_CARE_PROVIDER_SITE_OTHER): Payer: 59 | Admitting: Certified Nurse Midwife

## 2018-03-10 ENCOUNTER — Encounter: Payer: Self-pay | Admitting: Obstetrics

## 2018-03-10 VITALS — BP 121/73 | HR 89 | Wt 247.0 lb

## 2018-03-10 DIAGNOSIS — Z3402 Encounter for supervision of normal first pregnancy, second trimester: Secondary | ICD-10-CM

## 2018-03-10 DIAGNOSIS — Z6791 Unspecified blood type, Rh negative: Secondary | ICD-10-CM

## 2018-03-10 DIAGNOSIS — O99212 Obesity complicating pregnancy, second trimester: Secondary | ICD-10-CM

## 2018-03-10 DIAGNOSIS — O2602 Excessive weight gain in pregnancy, second trimester: Secondary | ICD-10-CM

## 2018-03-10 DIAGNOSIS — Z3400 Encounter for supervision of normal first pregnancy, unspecified trimester: Secondary | ICD-10-CM

## 2018-03-10 DIAGNOSIS — O26899 Other specified pregnancy related conditions, unspecified trimester: Secondary | ICD-10-CM

## 2018-03-10 DIAGNOSIS — O26892 Other specified pregnancy related conditions, second trimester: Secondary | ICD-10-CM

## 2018-03-10 NOTE — Progress Notes (Signed)
   PRENATAL VISIT NOTE  Subjective:  [name] is a 22 y.o. G1P0 at 9w6dbeing seen today for ongoing prenatal care.  She is currently monitored for the following issues for this low-risk pregnancy and has Supervision of normal first pregnancy, antepartum; History of umbilical hernia repair; Rh negative state in antepartum period; Obesity affecting pregnancy in second trimester; and Maternal excessive weight gain, second trimester on their problem list.  Patient reports no complaints.  Contractions: Not present. Vag. Bleeding: None.  Movement: Present. Denies leaking of fluid.   The following portions of the patient's history were reviewed and updated as appropriate: allergies, current medications, past family history, past medical history, past social history, past surgical history and problem list. Problem list updated.  Objective:   Vitals:   03/10/18 1020  BP: 121/73  Pulse: 89  Weight: 247 lb (112 kg)    Fetal Status: Fetal Heart Rate (bpm): 145; doppler Fundal Height: 25 cm Movement: Present     General:  Alert, oriented and cooperative. Patient is in no acute distress.  Skin: Skin is warm and dry. No rash noted.   Cardiovascular: Normal heart rate noted  Respiratory: Normal respiratory effort, no problems with respiration noted  Abdomen: Soft, gravid, appropriate for gestational age.  Pain/Pressure: Absent     Pelvic: Cervical exam deferred        Extremities: Normal range of motion.     Mental Status: Normal mood and affect. Normal behavior. Normal judgment and thought content.   Assessment and Plan:  Pregnancy: G1P0 at 257w6d1. Supervision of normal first pregnancy, antepartum     Elevated liver enzymes. F/U anatomy USKoreacheduled.  - Comp Met (CMET)  2. Rh negative state in antepartum period     Rhogam at 28 wks  3. Obesity affecting pregnancy in second trimester     Roughly 57 lbs of weight gained this pregnancy  4. Maternal excessive weight gain, second  trimester       Preterm labor symptoms and general obstetric precautions including but not limited to vaginal bleeding, contractions, leaking of fluid and fetal movement were reviewed in detail with the patient. Please refer to After Visit Summary for other counseling recommendations.  Return in about 1 month (around 04/07/2018) for ROB, 2 hr OGTT/Rhogam.  No future appointments.  RaMorene CrockerCNM

## 2018-03-11 LAB — COMPREHENSIVE METABOLIC PANEL
ALK PHOS: 86 IU/L (ref 39–117)
ALT: 27 IU/L (ref 0–32)
AST: 24 IU/L (ref 0–40)
Albumin/Globulin Ratio: 1.1 — ABNORMAL LOW (ref 1.2–2.2)
Albumin: 3.7 g/dL (ref 3.5–5.5)
BILIRUBIN TOTAL: 0.2 mg/dL (ref 0.0–1.2)
BUN/Creatinine Ratio: 11 (ref 9–23)
BUN: 6 mg/dL (ref 6–20)
CHLORIDE: 104 mmol/L (ref 96–106)
CO2: 17 mmol/L — AB (ref 20–29)
Calcium: 8.9 mg/dL (ref 8.7–10.2)
Creatinine, Ser: 0.53 mg/dL — ABNORMAL LOW (ref 0.57–1.00)
GFR calc Af Amer: 156 mL/min/{1.73_m2} (ref >59)
GFR calc non Af Amer: 135 mL/min/{1.73_m2} (ref >59)
GLOBULIN, TOTAL: 3.3 g/dL (ref 1.5–4.5)
Glucose: 83 mg/dL (ref 65–99)
POTASSIUM: 4.2 mmol/L (ref 3.5–5.2)
SODIUM: 135 mmol/L (ref 134–144)
Total Protein: 7 g/dL (ref 6.0–8.5)

## 2018-04-02 ENCOUNTER — Encounter (HOSPITAL_COMMUNITY): Payer: Self-pay

## 2018-04-02 ENCOUNTER — Other Ambulatory Visit: Payer: Self-pay

## 2018-04-02 ENCOUNTER — Inpatient Hospital Stay (HOSPITAL_COMMUNITY)
Admission: AD | Admit: 2018-04-02 | Discharge: 2018-04-02 | Disposition: A | Discharge status: Home / Self Care | Payer: 59 | Source: Ambulatory Visit | Attending: Obstetrics & Gynecology | Admitting: Obstetrics & Gynecology

## 2018-04-02 DIAGNOSIS — H538 Other visual disturbances: Secondary | ICD-10-CM | POA: Insufficient documentation

## 2018-04-02 DIAGNOSIS — R102 Pelvic and perineal pain: Secondary | ICD-10-CM | POA: Insufficient documentation

## 2018-04-02 DIAGNOSIS — Z800 Family history of malignant neoplasm of digestive organs: Secondary | ICD-10-CM | POA: Insufficient documentation

## 2018-04-02 DIAGNOSIS — R0602 Shortness of breath: Secondary | ICD-10-CM | POA: Insufficient documentation

## 2018-04-02 DIAGNOSIS — O99612 Diseases of the digestive system complicating pregnancy, second trimester: Secondary | ICD-10-CM | POA: Diagnosis not present

## 2018-04-02 DIAGNOSIS — O26892 Other specified pregnancy related conditions, second trimester: Secondary | ICD-10-CM | POA: Insufficient documentation

## 2018-04-02 DIAGNOSIS — Z3A27 27 weeks gestation of pregnancy: Secondary | ICD-10-CM | POA: Diagnosis not present

## 2018-04-02 DIAGNOSIS — O368120 Decreased fetal movements, second trimester, not applicable or unspecified: Secondary | ICD-10-CM | POA: Diagnosis not present

## 2018-04-02 DIAGNOSIS — K5900 Constipation, unspecified: Secondary | ICD-10-CM | POA: Insufficient documentation

## 2018-04-02 DIAGNOSIS — Z833 Family history of diabetes mellitus: Secondary | ICD-10-CM | POA: Diagnosis not present

## 2018-04-02 DIAGNOSIS — O26899 Other specified pregnancy related conditions, unspecified trimester: Secondary | ICD-10-CM

## 2018-04-02 DIAGNOSIS — Z9889 Other specified postprocedural states: Secondary | ICD-10-CM | POA: Diagnosis not present

## 2018-04-02 DIAGNOSIS — Z8249 Family history of ischemic heart disease and other diseases of the circulatory system: Secondary | ICD-10-CM | POA: Diagnosis not present

## 2018-04-02 DIAGNOSIS — Z807 Family history of other malignant neoplasms of lymphoid, hematopoietic and related tissues: Secondary | ICD-10-CM | POA: Diagnosis not present

## 2018-04-02 DIAGNOSIS — Z79899 Other long term (current) drug therapy: Secondary | ICD-10-CM | POA: Insufficient documentation

## 2018-04-02 DIAGNOSIS — R109 Unspecified abdominal pain: Secondary | ICD-10-CM | POA: Diagnosis present

## 2018-04-02 HISTORY — DX: Other specified health status: Z78.9

## 2018-04-02 LAB — URINALYSIS, ROUTINE W REFLEX MICROSCOPIC
Bacteria, UA: NONE SEEN
Bilirubin Urine: NEGATIVE
GLUCOSE, UA: NEGATIVE mg/dL
Hgb urine dipstick: NEGATIVE
KETONES UR: NEGATIVE mg/dL
Nitrite: NEGATIVE
PROTEIN: NEGATIVE mg/dL
Specific Gravity, Urine: 1.016 (ref 1.005–1.030)
pH: 7 (ref 5.0–8.0)

## 2018-04-02 NOTE — Discharge Instructions (Signed)
Constipation, Adult Constipation is when a person has fewer bowel movements in a week than normal, has difficulty having a bowel movement, or has stools that are dry, hard, or larger than normal. Constipation may be caused by an underlying condition. It may become worse with age if a person takes certain medicines and does not take in enough fluids. Follow these instructions at home: Eating and drinking   Eat foods that have a lot of fiber, such as fresh fruits and vegetables, whole grains, and beans.  Limit foods that are high in fat, low in fiber, or overly processed, such as french fries, hamburgers, cookies, candies, and soda.  Drink enough fluid to keep your urine clear or pale yellow. General instructions  Exercise regularly or as told by your health care provider.  Go to the restroom when you have the urge to go. Do not hold it in.  Take over-the-counter and prescription medicines only as told by your health care provider. These include any fiber supplements.  Practice pelvic floor retraining exercises, such as deep breathing while relaxing the lower abdomen and pelvic floor relaxation during bowel movements.  Watch your condition for any changes.  Keep all follow-up visits as told by your health care provider. This is important. Contact a health care provider if:  You have pain that gets worse.  You have a fever.  You do not have a bowel movement after 4 days.  You vomit.  You are not hungry.  You lose weight.  You are bleeding from the anus.  You have thin, pencil-like stools. Get help right away if:  You have a fever and your symptoms suddenly get worse.  You leak stool or have blood in your stool.  Your abdomen is bloated.  You have severe pain in your abdomen.  You feel dizzy or you faint. This information is not intended to replace advice given to you by your health care provider. Make sure you discuss any questions you have with your health care  provider. Document Released: 06/04/2004 Document Revised: 03/26/2016 Document Reviewed: 02/25/2016 Elsevier Interactive Patient Education  2018 [employer]   Round Ligament Pain The round ligament is a cord of muscle and tissue that helps to support the uterus. It can become a source of pain during pregnancy if it becomes stretched or twisted as the baby grows. The pain usually begins in the second trimester of pregnancy, and it can come and go until the baby is delivered. It is not a serious problem, and it does not cause harm to the baby. Round ligament pain is usually a short, sharp, and pinching pain, but it can also be a dull, lingering, and aching pain. The pain is felt in the lower side of the abdomen or in the groin. It usually starts deep in the groin and moves up to the outside of the hip area. Pain can occur with:  A sudden change in position.  Rolling over in bed.  Coughing or sneezing.  Physical activity.  Follow these instructions at home: Watch your condition for any changes. Take these steps to help with your pain:  When the pain starts, relax. Then try: ? Sitting down. ? Flexing your knees up to your abdomen. ? Lying on your side with one pillow under your abdomen and another pillow between your legs. ? Sitting in a warm bath for 15-20 minutes or until the pain goes away.  Take over-the-counter and prescription medicines only as told by your health care provider.  Move slowly when you sit and stand.  Avoid long walks if they cause pain.  Stop or lessen your physical activities if they cause pain.  Contact a health care provider if:  Your pain does not go away with treatment.  You feel pain in your back that you did not have before.  Your medicine is not helping. Get help right away if:  You develop a fever or chills.  You develop uterine contractions.  You develop vaginal bleeding.  You develop nausea or vomiting.  You develop diarrhea.  You  have pain when you urinate. This information is not intended to replace advice given to you by your health care provider. Make sure you discuss any questions you have with your health care provider. Document Released: 06/15/2008 Document Revised: 02/12/2016 Document Reviewed: 11/13/2014 Elsevier Interactive Patient Education  [employer].

## 2018-04-02 NOTE — MAU Note (Addendum)
Woke up this AM with sharp pain in lower r abdomen  SOB and blurry vision since this AM, feels like feet are swollen  No movement since 0500

## 2018-04-02 NOTE — MAU Provider Note (Signed)
Chief Complaint:  Abdominal Pain; Shortness of Breath; Blurred Vision; Leg Swelling; and Decreased Fetal Movement   First Provider Initiated Contact with Patient 04/02/18 1446     HPI: [name] is a 22 y.o. G1P0 at [redacted]w[redacted]d who presents to maternity admissions with multiple complaints.  Reports no fetal movement since 5 am this morning. States she hasn't "really felt" movement since being placed on the monitor.  Had RLQ pain this morning when she was trying to get out of bed. Pain was sharp in nature and only lasted for a few seconds. Pain has not continued. Has felt some abdominal tightening today, last time being over an hour ago. Denies n/v/d, vaginal bleeding, LOF, or urinary complaints.  Has had some occasional episodes of blurred vision for the last week and has noticed increasing BLE swelling over the weekend. Denies headache, epigastric pain, or hx of hypertension.  Last BM was this morning; hard small stool. Doesn't remember last BM before today and is not treating constipation.   Pregnancy Course:   Past Medical History:  Diagnosis Date  . Medical history non-contributory    OB History  Gravida Para Term Preterm AB Living  1            SAB TAB Ectopic Multiple Live Births               # Outcome Date GA Lbr Len/2nd Weight Sex Delivery Anes PTL Lv  1 Current            Past Surgical History:  Procedure Laterality Date  . UMBILICAL HERNIA REPAIR     Family History  Problem Relation Age of Onset  . Heart attack Father   . Cancer Father        stomach  . Cancer Other        lymphoma  . Diabetes Other    Social History   Tobacco Use  . Smoking status: Never Smoker  . Smokeless tobacco: Never Used  Substance Use Topics  . Alcohol use: No  . Drug use: No   No Known Allergies Medications Prior to Admission  Medication Sig Dispense Refill Last Dose  . Prenat-FeAsp-Meth-FA-DHA w/o A (PRENATE PIXIE) 10-0.6-0.4-200 MG CAPS Take 1 tablet by mouth daily. 30 capsule 12  Unknown at Unknown time  . promethazine (PHENERGAN) 25 MG tablet promethazine 25 mg tablet  Take 1 tablet every 6 hours by oral route as needed.       I have reviewed patient's Past Medical Hx, Surgical Hx, Family Hx, Social Hx, medications and allergies.   ROS:  Review of Systems  Constitutional: Negative.   Eyes: Positive for visual disturbance.  Cardiovascular: Positive for leg swelling. Negative for chest pain.  Gastrointestinal: Positive for abdominal pain (none currently) and constipation. Negative for blood in stool, nausea and vomiting.  Genitourinary: Negative.   Neurological: Negative for headaches.    Physical Exam   Patient Vitals for the past 24 hrs:  BP Temp Temp src Pulse Resp Weight  04/02/18 1400 124/69 98.2 F (36.8 C) Oral 87 18 250 lb (113.4 kg)   Constitutional: Well-developed, well-nourished female in no acute distress.  Cardiovascular: normal rate & rhythm Respiratory: normal effort GI: Abd soft, non-tender, gravid appropriate for gestational age. Pos BS x 4 MS: Extremities nontender, no edema, normal ROM Neurologic: Alert and oriented x 4. Patellar DTR 2+ bilaterally & no clonus.  GU: Cervix closed/thick  FHT:  Baseline 140 , moderate variability, 10x10 present, no decelerations Contractions: none  Labs: Results for orders placed or performed during the hospital encounter of 04/02/18 (from the past 24 hour(s))  Urinalysis, Routine w reflex microscopic     Status: Abnormal   Collection Time: 04/02/18  2:08 PM  Result Value Ref Range   Color, Urine YELLOW YELLOW   APPearance CLEAR CLEAR   Specific Gravity, Urine 1.016 1.005 - 1.030   pH 7.0 5.0 - 8.0   Glucose, UA NEGATIVE NEGATIVE mg/dL   Hgb urine dipstick NEGATIVE NEGATIVE   Bilirubin Urine NEGATIVE NEGATIVE   Ketones, ur NEGATIVE NEGATIVE mg/dL   Protein, ur NEGATIVE NEGATIVE mg/dL   Nitrite NEGATIVE NEGATIVE   Leukocytes, UA MODERATE (A) NEGATIVE   RBC / HPF 0-5 0 - 5 RBC/hpf   WBC, UA  0-5 0 - 5 WBC/hpf   Bacteria, UA NONE SEEN NONE SEEN   Squamous Epithelial / LPF 0-5 0 - 5    Imaging:  No results found.  MAU Course: Orders Placed This Encounter  Procedures  . Urinalysis, Routine w reflex microscopic  . Discharge patient   No orders of the defined types were placed in this encounter.   MDM: Category 1 tracing. Fetal movement heard through monitor.  Pt normotensive Minimal LE swelling, non pitting Pt otherwise asymptomatic at this time. Discussed that RLQ pain this morning was likely round ligament pain.  Also reviewed tx of constipation.   Assessment: 1. Pain of round ligament affecting pregnancy, antepartum   2. Decreased fetal movement affecting management of pregnancy in second trimester, single or unspecified fetus   3. [redacted] weeks gestation of pregnancy   4. Constipation during pregnancy in second trimester     Plan: Discharge home in stable condition.  Preterm Labor precautions  S/s PEC Keep f/u with OB Increased water & fiber intake. OTC stool softeners daily.    Allergies as of 04/02/2018   No Known Allergies     Medication List    TAKE these medications   PRENATE PIXIE 10-0.6-0.4-200 MG Caps Take 1 tablet by mouth daily.   promethazine 25 MG tablet Commonly known as:  PHENERGAN promethazine 25 mg tablet  Take 1 tablet every 6 hours by oral route as needed.       Judeth HornLawrence, Kayelee Herbig, NP 04/02/2018 2:46 PM

## 2018-04-07 ENCOUNTER — Other Ambulatory Visit: Payer: 59

## 2018-04-07 ENCOUNTER — Encounter: Payer: Self-pay | Admitting: Obstetrics

## 2018-04-07 ENCOUNTER — Other Ambulatory Visit: Payer: Self-pay | Admitting: Certified Nurse Midwife

## 2018-04-07 ENCOUNTER — Ambulatory Visit (INDEPENDENT_AMBULATORY_CARE_PROVIDER_SITE_OTHER): Payer: 59 | Admitting: Obstetrics

## 2018-04-07 ENCOUNTER — Ambulatory Visit (HOSPITAL_COMMUNITY)
Admission: RE | Admit: 2018-04-07 | Discharge: 2018-04-07 | Disposition: A | Discharge status: Home / Self Care | Payer: 59 | Source: Ambulatory Visit | Attending: Certified Nurse Midwife | Admitting: Certified Nurse Midwife

## 2018-04-07 VITALS — BP 124/75 | HR 87 | Wt 248.4 lb

## 2018-04-07 DIAGNOSIS — Z113 Encounter for screening for infections with a predominantly sexual mode of transmission: Secondary | ICD-10-CM

## 2018-04-07 DIAGNOSIS — Z3400 Encounter for supervision of normal first pregnancy, unspecified trimester: Secondary | ICD-10-CM

## 2018-04-07 DIAGNOSIS — Z362 Encounter for other antenatal screening follow-up: Secondary | ICD-10-CM

## 2018-04-07 DIAGNOSIS — O99212 Obesity complicating pregnancy, second trimester: Secondary | ICD-10-CM | POA: Insufficient documentation

## 2018-04-07 DIAGNOSIS — Z3A27 27 weeks gestation of pregnancy: Secondary | ICD-10-CM

## 2018-04-07 DIAGNOSIS — Z3403 Encounter for supervision of normal first pregnancy, third trimester: Secondary | ICD-10-CM

## 2018-04-07 NOTE — Progress Notes (Signed)
Patient reports good fetal movement, reports pain in her legs.

## 2018-04-07 NOTE — Progress Notes (Signed)
Subjective:  [name] is a 22 y.o. G1P0 at 3346w6d being seen today for ongoing prenatal care.  She is currently monitored for the following issues for this low-risk pregnancy and has Supervision of normal first pregnancy, antepartum; History of umbilical hernia repair; Rh negative state in antepartum period; Obesity affecting pregnancy in second trimester; and Maternal excessive weight gain, second trimester on their problem list.  Patient reports leg cramps.  Contractions: Not present. Vag. Bleeding: None.  Movement: Present. Denies leaking of fluid.   The following portions of the patient's history were reviewed and updated as appropriate: allergies, current medications, past family history, past medical history, past social history, past surgical history and problem list. Problem list updated.  Objective:   Vitals:   04/07/18 0900  BP: 124/75  Pulse: 87  Weight: 248 lb 6.4 oz (112.7 kg)    Fetal Status: Fetal Heart Rate (bpm): 140   Movement: Present     General:  Alert, oriented and cooperative. Patient is in no acute distress.  Skin: Skin is warm and dry. No rash noted.   Cardiovascular: Normal heart rate noted  Respiratory: Normal respiratory effort, no problems with respiration noted  Abdomen: Soft, gravid, appropriate for gestational age. Pain/Pressure: Present     Pelvic:  Cervical exam deferred        Extremities: Normal range of motion.     Mental Status: Normal mood and affect. Normal behavior. Normal judgment and thought content.   Urinalysis:      Assessment and Plan:  Pregnancy: G1P0 at 5146w6d  1. Supervision of normal first pregnancy, antepartum  2. Screening for STD (sexually transmitted disease) Rx: - Glucose Tolerance, 2 Hours w/1 Hour - CBC - RPR - HIV antibody  Preterm labor symptoms and general obstetric precautions including but not limited to vaginal bleeding, contractions, leaking of fluid and fetal movement were reviewed in detail with the  patient. Please refer to After Visit Summary for other counseling recommendations.  Return in about 2 weeks (around 04/21/2018) for ROB.   Brock BadHarper, Charles A, MD

## 2018-04-08 LAB — GLUCOSE TOLERANCE, 2 HOURS W/ 1HR
Glucose, 1 hour: 96 mg/dL (ref 65–179)
Glucose, 2 hour: 122 mg/dL (ref 65–152)
Glucose, Fasting: 76 mg/dL (ref 65–91)

## 2018-04-08 LAB — CBC
Hematocrit: 34.9 % (ref 34.0–46.6)
Hemoglobin: 10.8 g/dL — ABNORMAL LOW (ref 11.1–15.9)
MCH: 25.2 pg — ABNORMAL LOW (ref 26.6–33.0)
MCHC: 30.9 g/dL — ABNORMAL LOW (ref 31.5–35.7)
MCV: 82 fL (ref 79–97)
PLATELETS: 323 10*3/uL (ref 150–450)
RBC: 4.28 x10E6/uL (ref 3.77–5.28)
RDW: 14.7 % (ref 12.3–15.4)
WBC: 7.5 10*3/uL (ref 3.4–10.8)

## 2018-04-08 LAB — HIV ANTIBODY (ROUTINE TESTING W REFLEX): HIV Screen 4th Generation wRfx: NONREACTIVE

## 2018-04-08 LAB — RPR: RPR: NONREACTIVE

## 2018-04-21 ENCOUNTER — Ambulatory Visit (INDEPENDENT_AMBULATORY_CARE_PROVIDER_SITE_OTHER): Payer: 59 | Admitting: Certified Nurse Midwife

## 2018-04-21 VITALS — BP 115/75 | HR 83 | Wt 252.0 lb

## 2018-04-21 DIAGNOSIS — Z23 Encounter for immunization: Secondary | ICD-10-CM | POA: Diagnosis not present

## 2018-04-21 DIAGNOSIS — O360930 Maternal care for other rhesus isoimmunization, third trimester, not applicable or unspecified: Secondary | ICD-10-CM

## 2018-04-21 DIAGNOSIS — Z3400 Encounter for supervision of normal first pregnancy, unspecified trimester: Secondary | ICD-10-CM

## 2018-04-21 DIAGNOSIS — Z6791 Unspecified blood type, Rh negative: Secondary | ICD-10-CM

## 2018-04-21 DIAGNOSIS — O26899 Other specified pregnancy related conditions, unspecified trimester: Secondary | ICD-10-CM

## 2018-04-21 DIAGNOSIS — Z3403 Encounter for supervision of normal first pregnancy, third trimester: Secondary | ICD-10-CM

## 2018-04-21 MED ORDER — RHO D IMMUNE GLOBULIN 1500 UNIT/2ML IJ SOSY
300.0000 ug | PREFILLED_SYRINGE | Freq: Once | INTRAMUSCULAR | Status: AC
  Administered 2018-04-21: 300 ug via INTRAMUSCULAR

## 2018-04-21 NOTE — Patient Instructions (Signed)

## 2018-04-21 NOTE — Progress Notes (Signed)
Pt has no complaints. Rhogam and Tdap given.

## 2018-04-24 NOTE — Progress Notes (Signed)
   PRENATAL VISIT NOTE  Subjective:  [name] is a 22 y.o. G1P0 at 1061w2d being seen today for ongoing prenatal care.  She is currently monitored for the following issues for this low-risk pregnancy and has Supervision of normal first pregnancy, antepartum; History of umbilical hernia repair; Rh negative state in antepartum period; Obesity affecting pregnancy in second trimester; and Maternal excessive weight gain, second trimester on their problem list.  Patient reports no complaints.  Contractions: Not present. Vag. Bleeding: None.  Movement: Present. Denies leaking of fluid.   The following portions of the patient's history were reviewed and updated as appropriate: allergies, current medications, past family history, past medical history, past social history, past surgical history and problem list. Problem list updated.  Objective:   Vitals:   04/21/18 1128  BP: 115/75  Pulse: 83  Weight: 252 lb (114.3 kg)    Fetal Status: Fetal Heart Rate (bpm): 142 Fundal Height: 32 cm Movement: Present     General:  Alert, oriented and cooperative. Patient is in no acute distress.  Skin: Skin is warm and dry. No rash noted.   Cardiovascular: Normal heart rate noted  Respiratory: Normal respiratory effort, no problems with respiration noted  Abdomen: Soft, gravid, appropriate for gestational age.  Pain/Pressure: Present     Pelvic: Cervical exam deferred        Extremities: Normal range of motion.  Edema: Trace  Mental Status: Normal mood and affect. Normal behavior. Normal judgment and thought content.   Assessment and Plan:  Pregnancy: G1P0 at 3461w2d  1. Rh negative state in antepartum period -Discussed with patient possible need for Rhogam after delivery dependent on baby's blood type and importance of Rhogam with any abdominal trauma- patient verbalizes understanding  -Rhogam given in office today  - rho (d) immune globulin (RHIG/RHOPHYLAC) injection 300 mcg  2. Supervision of  normal first pregnancy, antepartum -Patient doing well, no complaints today- counting down until her due date and is excited about labor -Discussed labor plan with patient, recommended to start thinking about plan and to bring questions into prenatal appointments  - Tdap vaccine greater than or equal to 7yo IM  Preterm labor symptoms and general obstetric precautions including but not limited to vaginal bleeding, contractions, leaking of fluid and fetal movement were reviewed in detail with the patient. Please refer to After Visit Summary for other counseling recommendations.  Return in about 2 weeks (around 05/05/2018) for ROB.  Future Appointments  Date Time Provider Department Center  05/05/2018  9:30 AM Raelyn Moraawson, Rolitta, CNM [facility] None    Sharyon CableVeronica C Lakyia Behe, CNM

## 2018-05-05 ENCOUNTER — Inpatient Hospital Stay (HOSPITAL_COMMUNITY)
Admission: AD | Admit: 2018-05-05 | Discharge: 2018-05-05 | Disposition: A | Discharge status: Home / Self Care | Payer: 59 | Source: Ambulatory Visit | Attending: Obstetrics and Gynecology | Admitting: Obstetrics and Gynecology

## 2018-05-05 ENCOUNTER — Ambulatory Visit (INDEPENDENT_AMBULATORY_CARE_PROVIDER_SITE_OTHER): Payer: 59 | Admitting: Obstetrics and Gynecology

## 2018-05-05 ENCOUNTER — Encounter (HOSPITAL_COMMUNITY): Payer: Self-pay | Admitting: *Deleted

## 2018-05-05 ENCOUNTER — Encounter: Payer: Self-pay | Admitting: *Deleted

## 2018-05-05 VITALS — BP 100/69 | HR 76 | Wt 255.4 lb

## 2018-05-05 DIAGNOSIS — Z3689 Encounter for other specified antenatal screening: Secondary | ICD-10-CM

## 2018-05-05 DIAGNOSIS — O4703 False labor before 37 completed weeks of gestation, third trimester: Secondary | ICD-10-CM

## 2018-05-05 DIAGNOSIS — O4700 False labor before 37 completed weeks of gestation, unspecified trimester: Secondary | ICD-10-CM | POA: Diagnosis not present

## 2018-05-05 DIAGNOSIS — Z3A31 31 weeks gestation of pregnancy: Secondary | ICD-10-CM

## 2018-05-05 LAB — URINALYSIS, ROUTINE W REFLEX MICROSCOPIC
Bilirubin Urine: NEGATIVE
Glucose, UA: NEGATIVE mg/dL
HGB URINE DIPSTICK: NEGATIVE
KETONES UR: NEGATIVE mg/dL
Leukocytes, UA: NEGATIVE
NITRITE: NEGATIVE
PROTEIN: NEGATIVE mg/dL
Specific Gravity, Urine: 1.015 (ref 1.005–1.030)
pH: 7 (ref 5.0–8.0)

## 2018-05-05 LAB — CBC
HCT: 31.5 % — ABNORMAL LOW (ref 36.0–46.0)
Hemoglobin: 10.3 g/dL — ABNORMAL LOW (ref 12.0–15.0)
MCH: 25.6 pg — ABNORMAL LOW (ref 26.0–34.0)
MCHC: 32.7 g/dL (ref 30.0–36.0)
MCV: 78.2 fL (ref 78.0–100.0)
PLATELETS: 275 10*3/uL (ref 150–400)
RBC: 4.03 MIL/uL (ref 3.87–5.11)
RDW: 15.7 % — AB (ref 11.5–15.5)
WBC: 9.6 10*3/uL (ref 4.0–10.5)

## 2018-05-05 LAB — WET PREP, GENITAL
CLUE CELLS WET PREP: NONE SEEN
SPERM: NONE SEEN
TRICH WET PREP: NONE SEEN
Yeast Wet Prep HPF POC: NONE SEEN

## 2018-05-05 LAB — FETAL FIBRONECTIN: Fetal Fibronectin: NEGATIVE

## 2018-05-05 MED ORDER — NIFEDIPINE 10 MG PO CAPS
10.0000 mg | ORAL_CAPSULE | ORAL | Status: AC
  Administered 2018-05-05 (×3): 10 mg via ORAL
  Filled 2018-05-05 (×3): qty 1

## 2018-05-05 MED ORDER — BETAMETHASONE SOD PHOS & ACET 6 (3-3) MG/ML IJ SUSP
12.0000 mg | INTRAMUSCULAR | Status: DC
  Administered 2018-05-05: 12 mg via INTRAMUSCULAR
  Filled 2018-05-05: qty 2

## 2018-05-05 MED ORDER — LACTATED RINGERS IV BOLUS
1000.0000 mL | Freq: Once | INTRAVENOUS | Status: AC
  Administered 2018-05-05: 1000 mL via INTRAVENOUS

## 2018-05-05 NOTE — MAU Note (Signed)
Urine in lab 

## 2018-05-05 NOTE — Progress Notes (Signed)
   PRENATAL VISIT NOTE  Subjective:  [name] is a 22 y.o. G1P0 at 2173w6d being seen today for ongoing prenatal care.  She is currently monitored for the following issues for this low-risk pregnancy and has Supervision of normal first pregnancy, antepartum; History of umbilical hernia repair; Rh negative state in antepartum period; Obesity affecting pregnancy in second trimester; and Maternal excessive weight gain, second trimester on their problem list.  Patient reports contractions since 0830. She reports contractions every 10 mins in lower abdomen. .  Contractions: Irregular. Vag. Bleeding: None.  Movement: Present. Denies leaking of fluid.   The following portions of the patient's history were reviewed and updated as appropriate: allergies, current medications, past family history, past medical history, past social history, past surgical history and problem list. Problem list updated.  Objective:   Vitals:   05/05/18 0941  BP: 100/69  Pulse: 76  Weight: 255 lb 6.4 oz (115.8 kg)    Fetal Status: Fetal Heart Rate (bpm): 128 Fundal Height: 32 cm Movement: Present  Presentation: Undeterminable  General:  Alert, oriented and cooperative. Patient is in no acute distress.  Skin: Skin is warm and dry. No rash noted.   Cardiovascular: Normal heart rate noted  Respiratory: Normal respiratory effort, no problems with respiration noted  Abdomen: Soft, gravid, appropriate for gestational age.  Pain/Pressure: Present     Pelvic: Cervical exam performed Dilation: Closed Effacement (%): 50 Station: Ballotable -- fFN obtained prior to VE  Extremities: Normal range of motion.  Edema: Trace  Mental Status: Normal mood and affect. Normal behavior. Normal judgment and thought content.   Assessment and Plan:  Pregnancy: G1P0 at 1673w6d   1. Preterm uterine contractions in third trimester, antepartum - Fetal fibronectin -- STAT - Fetal nonstress test NST - FHR: 130 bpm / moderate variability /  accels present / decels absent / TOCO: regular every 4 mins  - Send to MAU for prolonged CEFM and possibly tocolytic tx // MAU provider notified  Preterm labor symptoms and general obstetric precautions including but not limited to vaginal bleeding, contractions, leaking of fluid and fetal movement were reviewed in detail with the patient. Please refer to After Visit Summary for other counseling recommendations.  Return in about 2 weeks (around 05/19/2018) for Return OB visit.  No future appointments.  Raelyn Moraolitta Bailey Kolbe, CNM

## 2018-05-05 NOTE — MAU Provider Note (Signed)
History     CSN: [id number]  Arrival date and time: 05/05/18 1107   First Provider Initiated Contact with Patient 05/05/18 1148      Chief Complaint  Patient presents with  . Contractions   G1 @31 .6 wks sent from office for ctx. Reports ctx started this am when she was getting ready for her OB appt. Ctx are q5-10 min. No VB, LOF, or discharge. Reports good FM.    OB History    Gravida  1   Para      Term      Preterm      AB      Living        SAB      TAB      Ectopic      Multiple      Live Births              Past Medical History:  Diagnosis Date  . Medical history non-contributory     Past Surgical History:  Procedure Laterality Date  . UMBILICAL HERNIA REPAIR      Family History  Problem Relation Age of Onset  . Heart attack Father   . Cancer Father        stomach  . Cancer Other        lymphoma  . Diabetes Other     Social History   Tobacco Use  . Smoking status: Never Smoker  . Smokeless tobacco: Never Used  Substance Use Topics  . Alcohol use: No  . Drug use: No    Allergies: No Known Allergies  Medications Prior to Admission  Medication Sig Dispense Refill Last Dose  . Prenat-FeAsp-Meth-FA-DHA w/o A (PRENATE PIXIE) 10-0.6-0.4-200 MG CAPS Take 1 tablet by mouth daily. 30 capsule 12 Taking  . promethazine (PHENERGAN) 25 MG tablet promethazine 25 mg tablet  Take 1 tablet every 6 hours by oral route as needed.   Not Taking    Review of Systems  Constitutional: Negative for fever.  Gastrointestinal: Positive for abdominal pain.  Genitourinary: Negative for vaginal bleeding and vaginal discharge.   Physical Exam   Blood pressure 112/72, pulse 83, temperature 97.9 F (36.6 C), resp. rate 16, height 5\' 8"  (1.727 m), last menstrual period 07/10/2017.  Physical Exam  Constitutional: She is oriented to person, place, and time. She appears well-developed and well-nourished. No distress.  HENT:  Head: Normocephalic and  atraumatic.  Neck: Normal range of motion.  Cardiovascular: Normal rate.  Respiratory: Effort normal. No respiratory distress.  GI: Soft. She exhibits no distension. There is no tenderness.  gravid  Genitourinary:  Genitourinary Comments: SVE: 1/40/-3, vtx  Musculoskeletal: Normal range of motion.  Neurological: She is alert and oriented to person, place, and time.  Skin: Skin is warm and dry.  Psychiatric: She has a normal mood and affect.  EFM: 125 bpm, mod variability, + accels, no decels Toco: 3-5  Results for orders placed or performed during the hospital encounter of 05/05/18 (from the past 24 hour(s))  Urinalysis, Routine w reflex microscopic     Status: None   Collection Time: 05/05/18 11:21 AM  Result Value Ref Range   Color, Urine YELLOW YELLOW   APPearance CLEAR CLEAR   Specific Gravity, Urine 1.015 1.005 - 1.030   pH 7.0 5.0 - 8.0   Glucose, UA NEGATIVE NEGATIVE mg/dL   Hgb urine dipstick NEGATIVE NEGATIVE   Bilirubin Urine NEGATIVE NEGATIVE   Ketones, ur NEGATIVE NEGATIVE mg/dL   Protein, ur  NEGATIVE NEGATIVE mg/dL   Nitrite NEGATIVE NEGATIVE   Leukocytes, UA NEGATIVE NEGATIVE  Wet prep, genital     Status: Abnormal   Collection Time: 05/05/18 11:53 AM  Result Value Ref Range   Yeast Wet Prep HPF POC NONE SEEN NONE SEEN   Trich, Wet Prep NONE SEEN NONE SEEN   Clue Cells Wet Prep HPF POC NONE SEEN NONE SEEN   WBC, Wet Prep HPF POC MANY (A) NONE SEEN   Sperm NONE SEEN   CBC     Status: Abnormal   Collection Time: 05/05/18 11:53 AM  Result Value Ref Range   WBC 9.6 4.0 - 10.5 K/uL   RBC 4.03 3.87 - 5.11 MIL/uL   Hemoglobin 10.3 (L) 12.0 - 15.0 g/dL   HCT 09.831.5 (L) 11.936.0 - 14.746.0 %   MCV 78.2 78.0 - 100.0 fL   MCH 25.6 (L) 26.0 - 34.0 pg   MCHC 32.7 30.0 - 36.0 g/dL   RDW 82.915.7 (H) 56.211.5 - 13.015.5 %   Platelets 275 150 - 400 K/uL   MAU Course  Procedures LR 1L Procardia x3  MDM Labs ordered and reviewed. FFN negative (obtained at office). Feeling better after  Procardia, ctx rare on toco. Cervix unchanged. Will given BMZ and discharge home. Return tomorrow for second dose.  Assessment and Plan   1. [redacted] weeks gestation of pregnancy   2. NST (non-stress test) reactive   3. Preterm uterine contractions in third trimester, antepartum    Discharge home Follow up in MAU tomorrow for BMZ PTL precautions Hydrate Pelvic rest  Allergies as of 05/05/2018   No Known Allergies     Medication List    TAKE these medications   PRENATE PIXIE 10-0.6-0.4-200 MG Caps Take 1 tablet by mouth daily.   promethazine 25 MG tablet Commonly known as:  PHENERGAN promethazine 25 mg tablet  Take 1 tablet every 6 hours by oral route as needed.      Donette LarryMelanie Montague Corella, CNM 05/05/2018, 2:37 PM

## 2018-05-05 NOTE — Discharge Instructions (Signed)
Braxton Hicks Contractions °Contractions of the uterus can occur throughout pregnancy, but they are not always a sign that you are in labor. You may have practice contractions called Braxton Hicks contractions. These false labor contractions are sometimes confused with true labor. °What are Braxton Hicks contractions? °Braxton Hicks contractions are tightening movements that occur in the muscles of the uterus before labor. Unlike true labor contractions, these contractions do not result in opening (dilation) and thinning of the cervix. Toward the end of pregnancy (32-34 weeks), Braxton Hicks contractions can happen more often and may become stronger. These contractions are sometimes difficult to tell apart from true labor because they can be very uncomfortable. You should not feel embarrassed if you go to the hospital with false labor. °Sometimes, the only way to tell if you are in true labor is for your health care provider to look for changes in the cervix. The health care provider will do a physical exam and may monitor your contractions. If you are not in true labor, the exam should show that your cervix is not dilating and your water has not broken. °If there are other health problems associated with your pregnancy, it is completely safe for you to be sent home with false labor. You may continue to have Braxton Hicks contractions until you go into true labor. °How to tell the difference between true labor and false labor °True labor °· Contractions last 30-70 seconds. °· Contractions become very regular. °· Discomfort is usually felt in the top of the uterus, and it spreads to the lower abdomen and low back. °· Contractions do not go away with walking. °· Contractions usually become more intense and increase in frequency. °· The cervix dilates and gets thinner. °False labor °· Contractions are usually shorter and not as strong as true labor contractions. °· Contractions are usually irregular. °· Contractions  are often felt in the front of the lower abdomen and in the groin. °· Contractions may go away when you walk around or change positions while lying down. °· Contractions get weaker and are shorter-lasting as time goes on. °· The cervix usually does not dilate or become thin. °Follow these instructions at home: °· Take over-the-counter and prescription medicines only as told by your health care provider. °· Keep up with your usual exercises and follow other instructions from your health care provider. °· Eat and drink lightly if you think you are going into labor. °· If Braxton Hicks contractions are making you uncomfortable: °? Change your position from lying down or resting to walking, or change from walking to resting. °? Sit and rest in a tub of warm water. °? Drink enough fluid to keep your urine pale yellow. Dehydration may cause these contractions. °? Do slow and deep breathing several times an hour. °· Keep all follow-up prenatal visits as told by your health care provider. This is important. °Contact a health care provider if: °· You have a fever. °· You have continuous pain in your abdomen. °Get help right away if: °· Your contractions become stronger, more regular, and closer together. °· You have fluid leaking or gushing from your vagina. °· You pass blood-tinged mucus (bloody show). °· You have bleeding from your vagina. °· You have low back pain that you never had before. °· You feel your baby’s head pushing down and causing pelvic pressure. °· Your baby is not moving inside you as much as it used to. °Summary °· Contractions that occur before labor are called Braxton   Hicks contractions, false labor, or practice contractions. °· Braxton Hicks contractions are usually shorter, weaker, farther apart, and less regular than true labor contractions. True labor contractions usually become progressively stronger and regular and they become more frequent. °· Manage discomfort from Braxton Hicks contractions by  changing position, resting in a warm bath, drinking plenty of water, or practicing deep breathing. °This information is not intended to replace advice given to you by your health care provider. Make sure you discuss any questions you have with your health care provider. °Document Released: 01/20/2017 Document Revised: 01/20/2017 Document Reviewed: 01/20/2017 °Elsevier Interactive Patient Education © 2018 [employer] ° °

## 2018-05-05 NOTE — MAU Note (Signed)
Pt presents to MAU from office after her appointment for contractions. Denies any VB or LOF. +FM

## 2018-05-05 NOTE — Patient Instructions (Signed)

## 2018-05-05 NOTE — Progress Notes (Signed)
Pt is G1P0 7228w6d here for ROB visit. Pt states she has been having contractions since 8am that are about 10 mins apart lasting about 30 seconds. Pt reports good fetal movement, denies any bleeding or abnormal discharge.

## 2018-05-06 ENCOUNTER — Inpatient Hospital Stay (HOSPITAL_COMMUNITY)
Admission: AD | Admit: 2018-05-06 | Discharge: 2018-05-06 | Disposition: A | Discharge status: Home / Self Care | Payer: 59 | Source: Ambulatory Visit | Attending: Family Medicine | Admitting: Family Medicine

## 2018-05-06 DIAGNOSIS — Z3A31 31 weeks gestation of pregnancy: Secondary | ICD-10-CM | POA: Insufficient documentation

## 2018-05-06 MED ORDER — BETAMETHASONE SOD PHOS & ACET 6 (3-3) MG/ML IJ SUSP
12.0000 mg | Freq: Once | INTRAMUSCULAR | Status: AC
  Administered 2018-05-06: 12 mg via INTRAMUSCULAR
  Filled 2018-05-06: qty 2

## 2018-05-06 NOTE — MAU Note (Signed)
Pt here for 2nd BMZ shot. No other concerns

## 2018-05-08 LAB — GC/CHLAMYDIA PROBE AMP (~~LOC~~) NOT AT ARMC
CHLAMYDIA, DNA PROBE: NEGATIVE
NEISSERIA GONORRHEA: NEGATIVE

## 2018-05-11 ENCOUNTER — Inpatient Hospital Stay (HOSPITAL_COMMUNITY)
Admission: AD | Admit: 2018-05-11 | Discharge: 2018-05-11 | Disposition: A | Discharge status: Home / Self Care | Payer: 59 | Source: Ambulatory Visit | Attending: Obstetrics and Gynecology | Admitting: Obstetrics and Gynecology

## 2018-05-11 ENCOUNTER — Other Ambulatory Visit: Payer: Self-pay

## 2018-05-11 ENCOUNTER — Encounter (HOSPITAL_COMMUNITY): Payer: Self-pay | Admitting: *Deleted

## 2018-05-11 DIAGNOSIS — O4703 False labor before 37 completed weeks of gestation, third trimester: Secondary | ICD-10-CM | POA: Diagnosis not present

## 2018-05-11 DIAGNOSIS — Z3400 Encounter for supervision of normal first pregnancy, unspecified trimester: Secondary | ICD-10-CM

## 2018-05-11 DIAGNOSIS — Z3A32 32 weeks gestation of pregnancy: Secondary | ICD-10-CM | POA: Diagnosis not present

## 2018-05-11 DIAGNOSIS — O479 False labor, unspecified: Secondary | ICD-10-CM

## 2018-05-11 DIAGNOSIS — O4700 False labor before 37 completed weeks of gestation, unspecified trimester: Secondary | ICD-10-CM

## 2018-05-11 LAB — CBC
HCT: 32.8 % — ABNORMAL LOW (ref 36.0–46.0)
Hemoglobin: 10.4 g/dL — ABNORMAL LOW (ref 12.0–15.0)
MCH: 24.5 pg — ABNORMAL LOW (ref 26.0–34.0)
MCHC: 31.7 g/dL (ref 30.0–36.0)
MCV: 77.2 fL — ABNORMAL LOW (ref 78.0–100.0)
Platelets: 303 10*3/uL (ref 150–400)
RBC: 4.25 MIL/uL (ref 3.87–5.11)
RDW: 15.9 % — ABNORMAL HIGH (ref 11.5–15.5)
WBC: 8.7 10*3/uL (ref 4.0–10.5)

## 2018-05-11 NOTE — MAU Note (Signed)
Having contractions. Started around 1430.coming every 5-10 min. No bleeding or leaking

## 2018-05-11 NOTE — MAU Provider Note (Signed)
Chief Complaint:  Contractions   First Provider Initiated Contact with Patient 05/11/18 1810      HPI: [name] is a 22 y.o. G1P0 at 22w5dwho presents to maternity admissions reporting contractions. She reports contractions that started this afternoon while she was at work, she reports not feeling well then noticed contractions were occurring. She reports contractions being 10-15 minutes apart and not having to breath through contractions. States she notices the tightening and pressure in her hip like "hip is going to break every time I move even when not having a contraction". Rates contractions 4/10 when she has them- she reports contractions spacing out since arrival to MAU. She reports good fetal movement, denies LOF, vaginal bleeding, vaginal itching/burning, urinary symptoms, h/a, dizziness, n/v, or fever/chills.  She receives prenatal care at [facility]. Was seen in MAU yesterday for same complaint and noted to be 1cm/thick.  Past Medical History: Past Medical History:  Diagnosis Date  . Medical history non-contributory     Past obstetric history: OB History  Gravida Para Term Preterm AB Living  1            SAB TAB Ectopic Multiple Live Births               # Outcome Date GA Lbr Len/2nd Weight Sex Delivery Anes PTL Lv  1 Current             Past Surgical History: Past Surgical History:  Procedure Laterality Date  . UMBILICAL HERNIA REPAIR      Family History: Family History  Problem Relation Age of Onset  . Cancer Father        stomach  . Cancer Other        lymphoma  . Diabetes Other     Social History: Social History   Tobacco Use  . Smoking status: Never Smoker  . Smokeless tobacco: Never Used  Substance Use Topics  . Alcohol use: No  . Drug use: No    Allergies: No Known Allergies  Meds:  Medications Prior to Admission  Medication Sig Dispense Refill Last Dose  . Prenat-FeAsp-Meth-FA-DHA w/o A (PRENATE PIXIE) 10-0.6-0.4-200 MG CAPS Take 1  tablet by mouth daily. 30 capsule 12 Taking  . promethazine (PHENERGAN) 25 MG tablet promethazine 25 mg tablet  Take 1 tablet every 6 hours by oral route as needed.   Not Taking    ROS:  Review of Systems  Constitutional: Negative.   Respiratory: Negative.   Cardiovascular: Negative.   Gastrointestinal: Positive for abdominal pain. Negative for constipation, diarrhea, nausea and vomiting.       Contractions  Genitourinary: Negative.   Musculoskeletal: Negative.    I have reviewed patient's Past Medical Hx, Surgical Hx, Family Hx, Social Hx, medications and allergies.   Physical Exam   Patient Vitals for the past 24 hrs:  BP Temp Temp src Pulse Resp SpO2 Weight  05/11/18 1923 - - - - 19 - -  05/11/18 1919 118/70 - - 82 - - -  05/11/18 1724 124/74 98 F (36.7 C) Oral (!) 118 20 100 % 116.8 kg   Constitutional: Well-developed, obese female in no acute distress.  Cardiovascular: normal rate Respiratory: normal effort GI: Abd soft, non-tender, gravid large for gestational age.  MS: Extremities nontender, no edema, normal ROM Neurologic: Alert and oriented x 4.  GU: Neg CVAT.  CERVICAL EXAM: no cervical change since examination yesterday  Dilation: 1 Effacement (%): Thick Cervical Position: Posterior Exam by:: Lanice Shirts CNM  FHT:  Baseline 135 , moderate variability, accelerations present, no decelerations Contractions: q 10-15 mins   Labs: Results for orders placed or performed during the hospital encounter of 05/11/18 (from the past 24 hour(s))  CBC     Status: Abnormal   Collection Time: 05/11/18  6:37 PM  Result Value Ref Range   WBC 8.7 4.0 - 10.5 K/uL   RBC 4.25 3.87 - 5.11 MIL/uL   Hemoglobin 10.4 (L) 12.0 - 15.0 g/dL   HCT 11.932.8 (L) 14.736.0 - 82.946.0 %   MCV 77.2 (L) 78.0 - 100.0 fL   MCH 24.5 (L) 26.0 - 34.0 pg   MCHC 31.7 30.0 - 36.0 g/dL   RDW 56.215.9 (H) 13.011.5 - 86.515.5 %   Platelets 303 150 - 400 K/uL   O/Negative/-- (04/05 0000)  MAU Course/MDM: Orders Placed  This Encounter  Procedures  . CBC   NST reviewed- reactive for gestational age  CBC- WNL    Educated and discussed braxton hicks contractions vs labor contractions. Encouraged patient to get maternity support belt to decrease amount of pressure on pelvis due to fetus being low. Patient has a hx of hip fracture as a teenager and reports increased pain as fetus gets lower. Discussed safe medications to take in pregnancy for pain. Patient verbalizes understanding.  Pt discharge with strict PTL precautions and reasons to return to MAU   Today's evaluation included a work-up for preterm labor which can be life-threatening for both mom and baby.  Assessment: 1. Braxton Hicks contractions   2. [redacted] weeks gestation of pregnancy     Plan: Discharge home Preterm Labor precautions and fetal kick counts Follow up as scheduled for prenatal appointments  Return to MAU as needed  Maternity support belt  Hydration  Tylenol use for pain   Follow-up Information    [facility]   Specialty:  Obstetrics and Gynecology Why:  Follow up as scheduled for prenatal appointments and return to MAU as needed Contact information: [address], [address] [place] [id number] [phone]          Allergies as of 05/11/2018   No Known Allergies     Medication List    TAKE these medications   PRENATE PIXIE 10-0.6-0.4-200 MG Caps Take 1 tablet by mouth daily.   promethazine 25 MG tablet Commonly known as:  PHENERGAN promethazine 25 mg tablet  Take 1 tablet every 6 hours by oral route as needed.       Steward DroneVeronica Cece Milhouse Certified Nurse-Midwife 05/11/2018 7:08 PM

## 2018-05-11 NOTE — MAU Note (Signed)
Urine in lab 

## 2018-05-11 NOTE — Discharge Instructions (Signed)
Braxton Hicks Contractions °Contractions of the uterus can occur throughout pregnancy, but they are not always a sign that you are in labor. You may have practice contractions called Braxton Hicks contractions. These false labor contractions are sometimes confused with true labor. °What are Braxton Hicks contractions? °Braxton Hicks contractions are tightening movements that occur in the muscles of the uterus before labor. Unlike true labor contractions, these contractions do not result in opening (dilation) and thinning of the cervix. Toward the end of pregnancy (32-34 weeks), Braxton Hicks contractions can happen more often and may become stronger. These contractions are sometimes difficult to tell apart from true labor because they can be very uncomfortable. You should not feel embarrassed if you go to the hospital with false labor. °Sometimes, the only way to tell if you are in true labor is for your health care provider to look for changes in the cervix. The health care provider will do a physical exam and may monitor your contractions. If you are not in true labor, the exam should show that your cervix is not dilating and your water has not broken. °If there are other health problems associated with your pregnancy, it is completely safe for you to be sent home with false labor. You may continue to have Braxton Hicks contractions until you go into true labor. °How to tell the difference between true labor and false labor °True labor °· Contractions last 30-70 seconds. °· Contractions become very regular. °· Discomfort is usually felt in the top of the uterus, and it spreads to the lower abdomen and low back. °· Contractions do not go away with walking. °· Contractions usually become more intense and increase in frequency. °· The cervix dilates and gets thinner. °False labor °· Contractions are usually shorter and not as strong as true labor contractions. °· Contractions are usually irregular. °· Contractions  are often felt in the front of the lower abdomen and in the groin. °· Contractions may go away when you walk around or change positions while lying down. °· Contractions get weaker and are shorter-lasting as time goes on. °· The cervix usually does not dilate or become thin. °Follow these instructions at home: °· Take over-the-counter and prescription medicines only as told by your health care provider. °· Keep up with your usual exercises and follow other instructions from your health care provider. °· Eat and drink lightly if you think you are going into labor. °· If Braxton Hicks contractions are making you uncomfortable: °? Change your position from lying down or resting to walking, or change from walking to resting. °? Sit and rest in a tub of warm water. °? Drink enough fluid to keep your urine pale yellow. Dehydration may cause these contractions. °? Do slow and deep breathing several times an hour. °· Keep all follow-up prenatal visits as told by your health care provider. This is important. °Contact a health care provider if: °· You have a fever. °· You have continuous pain in your abdomen. °Get help right away if: °· Your contractions become stronger, more regular, and closer together. °· You have fluid leaking or gushing from your vagina. °· You pass blood-tinged mucus (bloody show). °· You have bleeding from your vagina. °· You have low back pain that you never had before. °· You feel your baby’s head pushing down and causing pelvic pressure. °· Your baby is not moving inside you as much as it used to. °Summary °· Contractions that occur before labor are called Braxton   Hicks contractions, false labor, or practice contractions. °· Braxton Hicks contractions are usually shorter, weaker, farther apart, and less regular than true labor contractions. True labor contractions usually become progressively stronger and regular and they become more frequent. °· Manage discomfort from Braxton Hicks contractions by  changing position, resting in a warm bath, drinking plenty of water, or practicing deep breathing. °This information is not intended to replace advice given to you by your health care provider. Make sure you discuss any questions you have with your health care provider. °Document Released: 01/20/2017 Document Revised: 01/20/2017 Document Reviewed: 01/20/2017 °Elsevier Interactive Patient Education © 2018 [employer] ° °

## 2018-05-17 ENCOUNTER — Ambulatory Visit (INDEPENDENT_AMBULATORY_CARE_PROVIDER_SITE_OTHER): Payer: 59 | Admitting: Medical

## 2018-05-17 VITALS — BP 126/82 | HR 90 | Wt 255.0 lb

## 2018-05-17 DIAGNOSIS — O26899 Other specified pregnancy related conditions, unspecified trimester: Secondary | ICD-10-CM

## 2018-05-17 DIAGNOSIS — Z3400 Encounter for supervision of normal first pregnancy, unspecified trimester: Secondary | ICD-10-CM

## 2018-05-17 DIAGNOSIS — Z6791 Unspecified blood type, Rh negative: Secondary | ICD-10-CM

## 2018-05-17 NOTE — Progress Notes (Signed)
   PRENATAL VISIT NOTE  Subjective:  [name] is a 22 y.o. G1P0 at 6758w4d being seen today for ongoing prenatal care.  She is currently monitored for the following issues for this low-risk pregnancy and has Supervision of normal first pregnancy, antepartum; History of umbilical hernia repair; Rh negative state in antepartum period; Obesity affecting pregnancy in second trimester; Maternal excessive weight gain, second trimester; and Threatened preterm labor, antepartum on their problem list.  Patient reports irregular contractions.  Contractions: Irregular. Vag. Bleeding: None.  Movement: Present. Denies leaking of fluid.   The following portions of the patient's history were reviewed and updated as appropriate: allergies, current medications, past family history, past medical history, past social history, past surgical history and problem list. Problem list updated.  Objective:   Vitals:   05/17/18 1055  BP: 126/82  Pulse: 90  Weight: 255 lb (115.7 kg)    Fetal Status: Fetal Heart Rate (bpm): 127 Fundal Height: 34 cm Movement: Present     General:  Alert, oriented and cooperative. Patient is in no acute distress.  Skin: Skin is warm and dry. No rash noted.   Cardiovascular: Normal heart rate noted  Respiratory: Normal respiratory effort, no problems with respiration noted  Abdomen: Soft, gravid, appropriate for gestational age.  Pain/Pressure: Present     Pelvic: Cervical exam deferred        Extremities: Normal range of motion.  Edema: Trace  Mental Status: Normal mood and affect. Normal behavior. Normal judgment and thought content.   Assessment and Plan:  Pregnancy: G1P0 at 8558w4d  1. Supervision of normal first pregnancy, antepartum - Continues to complain of irregular contractions, although none today  - Negative FFN < 2 weeks ago - Seen in MAU last week and cervix was unchanged - Will defer cervical exam today since she has not had any contractions today  - Discussed  appropriate hydration and regular use of abdominal binder for avoiding BH contractions   2. Rh negative state in antepartum period - Rhogam given at 28 weeks  Preterm labor symptoms and general obstetric precautions including but not limited to vaginal bleeding, contractions, leaking of fluid and fetal movement were reviewed in detail with the patient. Please refer to After Visit Summary for other counseling recommendations.  Return in about 2 weeks (around 05/31/2018) for LOB.   Vonzella NippleJulie Ahaan Zobrist, PA-C

## 2018-05-17 NOTE — Patient Instructions (Signed)
Research childbirth classes and hospital preregistration at [email or web address]  Fetal Movement Counts Patient Name: ________________________________________________ Patient Due Date: ____________________ What is a fetal movement count? A fetal movement count is the number of times that you feel your baby move during a certain amount of time. This may also be called a fetal kick count. A fetal movement count is recommended for every pregnant woman. You may be asked to start counting fetal movements as early as week 28 of your pregnancy. Pay attention to when your baby is most active. You may notice your baby's sleep and wake cycles. You may also notice things that make your baby move more. You should do a fetal movement count:  When your baby is normally most active.  At the same time each day.  A good time to count movements is while you are resting, after having something to eat and drink. How do I count fetal movements? 1. Find a quiet, comfortable area. Sit, or lie down on your side. 2. Write down the date, the start time and stop time, and the number of movements that you felt between those two times. Take this information with you to your health care visits. 3. For 2 hours, count kicks, flutters, swishes, rolls, and jabs. You should feel at least 10 movements during 2 hours. 4. You may stop counting after you have felt 10 movements. 5. If you do not feel 10 movements in 2 hours, have something to eat and drink. Then, keep resting and counting for 1 hour. If you feel at least 4 movements during that hour, you may stop counting. Contact a health care provider if:  You feel fewer than 4 movements in 2 hours.  Your baby is not moving like he or she usually does. Date: ____________ Start time: ____________ Stop time: ____________ Movements: ____________ Date: ____________ Start time: ____________ Stop time: ____________ Movements: ____________ Date: ____________ Start time: ____________  Stop time: ____________ Movements: ____________ Date: ____________ Start time: ____________ Stop time: ____________ Movements: ____________ Date: ____________ Start time: ____________ Stop time: ____________ Movements: ____________ Date: ____________ Start time: ____________ Stop time: ____________ Movements: ____________ Date: ____________ Start time: ____________ Stop time: ____________ Movements: ____________ Date: ____________ Start time: ____________ Stop time: ____________ Movements: ____________ Date: ____________ Start time: ____________ Stop time: ____________ Movements: ____________ This information is not intended to replace advice given to you by your health care provider. Make sure you discuss any questions you have with your health care provider. Document Released: 10/06/2006 Document Revised: 05/05/2016 Document Reviewed: 10/16/2015 Elsevier Interactive Patient Education  2018 Elsevier Inc[employer]  Braxton Hicks Contractions Contractions of the uterus can occur throughout pregnancy, but they are not always a sign that you are in labor. You may have practice contractions called Braxton Hicks contractions. These false labor contractions are sometimes confused with true labor. What are Braxton Hicks contractions? Braxton Hicks contractions are tightening movements that occur in the muscles of the uterus before labor. Unlike true labor contractions, these contractions do not result in opening (dilation) and thinning of the cervix. Toward the end of pregnancy (32-34 weeks), Braxton Hicks contractions can happen more often and may become stronger. These contractions are sometimes difficult to tell apart from true labor because they can be very uncomfortable. You should not feel embarrassed if you go to the hospital with false labor. Sometimes, the only way to tell if you are in true labor is for your health care provider to look for changes in the cervix. The health care provider will   do a physical  exam and may monitor your contractions. If you are not in true labor, the exam should show that your cervix is not dilating and your water has not broken. If there are other health problems associated with your pregnancy, it is completely safe for you to be sent home with false labor. You may continue to have Braxton Hicks contractions until you go into true labor. How to tell the difference between true labor and false labor True labor  Contractions last 30-70 seconds.  Contractions become very regular.  Discomfort is usually felt in the top of the uterus, and it spreads to the lower abdomen and low back.  Contractions do not go away with walking.  Contractions usually become more intense and increase in frequency.  The cervix dilates and gets thinner. False labor  Contractions are usually shorter and not as strong as true labor contractions.  Contractions are usually irregular.  Contractions are often felt in the front of the lower abdomen and in the groin.  Contractions may go away when you walk around or change positions while lying down.  Contractions get weaker and are shorter-lasting as time goes on.  The cervix usually does not dilate or become thin. Follow these instructions at home:  Take over-the-counter and prescription medicines only as told by your health care provider.  Keep up with your usual exercises and follow other instructions from your health care provider.  Eat and drink lightly if you think you are going into labor.  If Braxton Hicks contractions are making you uncomfortable: ? Change your position from lying down or resting to walking, or change from walking to resting. ? Sit and rest in a tub of warm water. ? Drink enough fluid to keep your urine pale yellow. Dehydration may cause these contractions. ? Do slow and deep breathing several times an hour.  Keep all follow-up prenatal visits as told by your health care provider. This is  important. Contact a health care provider if:  You have a fever.  You have continuous pain in your abdomen. Get help right away if:  Your contractions become stronger, more regular, and closer together.  You have fluid leaking or gushing from your vagina.  You pass blood-tinged mucus (bloody show).  You have bleeding from your vagina.  You have low back pain that you never had before.  You feel your baby's head pushing down and causing pelvic pressure.  Your baby is not moving inside you as much as it used to. Summary  Contractions that occur before labor are called Braxton Hicks contractions, false labor, or practice contractions.  Braxton Hicks contractions are usually shorter, weaker, farther apart, and less regular than true labor contractions. True labor contractions usually become progressively stronger and regular and they become more frequent.  Manage discomfort from Braxton Hicks contractions by changing position, resting in a warm bath, drinking plenty of water, or practicing deep breathing. This information is not intended to replace advice given to you by your health care provider. Make sure you discuss any questions you have with your health care provider. Document Released: 01/20/2017 Document Revised: 01/20/2017 Document Reviewed: 01/20/2017 Elsevier Interactive Patient Education  2018 Elsevier Inc.    

## 2018-05-21 ENCOUNTER — Inpatient Hospital Stay (HOSPITAL_COMMUNITY)
Admission: AD | Admit: 2018-05-21 | Discharge: 2018-05-23 | DRG: 807 | Disposition: A | Discharge status: Home / Self Care | Payer: 59 | Attending: Obstetrics & Gynecology | Admitting: Obstetrics & Gynecology

## 2018-05-21 ENCOUNTER — Inpatient Hospital Stay (HOSPITAL_COMMUNITY): Payer: 59 | Admitting: Anesthesiology

## 2018-05-21 ENCOUNTER — Other Ambulatory Visit: Payer: Self-pay

## 2018-05-21 ENCOUNTER — Encounter (HOSPITAL_COMMUNITY): Payer: Self-pay | Admitting: *Deleted

## 2018-05-21 DIAGNOSIS — O26893 Other specified pregnancy related conditions, third trimester: Secondary | ICD-10-CM | POA: Diagnosis present

## 2018-05-21 DIAGNOSIS — Z6791 Unspecified blood type, Rh negative: Secondary | ICD-10-CM

## 2018-05-21 DIAGNOSIS — O4202 Full-term premature rupture of membranes, onset of labor within 24 hours of rupture: Secondary | ICD-10-CM

## 2018-05-21 DIAGNOSIS — Z3A34 34 weeks gestation of pregnancy: Secondary | ICD-10-CM

## 2018-05-21 DIAGNOSIS — O99214 Obesity complicating childbirth: Secondary | ICD-10-CM | POA: Diagnosis present

## 2018-05-21 DIAGNOSIS — O99824 Streptococcus B carrier state complicating childbirth: Secondary | ICD-10-CM | POA: Diagnosis not present

## 2018-05-21 DIAGNOSIS — E669 Obesity, unspecified: Secondary | ICD-10-CM | POA: Diagnosis present

## 2018-05-21 DIAGNOSIS — O4700 False labor before 37 completed weeks of gestation, unspecified trimester: Secondary | ICD-10-CM | POA: Diagnosis present

## 2018-05-21 DIAGNOSIS — O42913 Preterm premature rupture of membranes, unspecified as to length of time between rupture and onset of labor, third trimester: Secondary | ICD-10-CM | POA: Diagnosis present

## 2018-05-21 DIAGNOSIS — O42919 Preterm premature rupture of membranes, unspecified as to length of time between rupture and onset of labor, unspecified trimester: Secondary | ICD-10-CM | POA: Diagnosis present

## 2018-05-21 DIAGNOSIS — O26899 Other specified pregnancy related conditions, unspecified trimester: Secondary | ICD-10-CM

## 2018-05-21 DIAGNOSIS — O99212 Obesity complicating pregnancy, second trimester: Secondary | ICD-10-CM | POA: Diagnosis present

## 2018-05-21 DIAGNOSIS — Z3400 Encounter for supervision of normal first pregnancy, unspecified trimester: Secondary | ICD-10-CM

## 2018-05-21 LAB — CBC
HEMATOCRIT: 33 % — AB (ref 36.0–46.0)
HEMOGLOBIN: 10.6 g/dL — AB (ref 12.0–15.0)
MCH: 24.2 pg — ABNORMAL LOW (ref 26.0–34.0)
MCHC: 32.1 g/dL (ref 30.0–36.0)
MCV: 75.3 fL — ABNORMAL LOW (ref 78.0–100.0)
Platelets: 271 10*3/uL (ref 150–400)
RBC: 4.38 MIL/uL (ref 3.87–5.11)
RDW: 16.3 % — AB (ref 11.5–15.5)
WBC: 8.4 10*3/uL (ref 4.0–10.5)

## 2018-05-21 LAB — POCT FERN TEST: POCT Fern Test: POSITIVE

## 2018-05-21 LAB — GROUP B STREP BY PCR: Group B strep by PCR: POSITIVE — AB

## 2018-05-21 LAB — RPR: RPR Ser Ql: NONREACTIVE

## 2018-05-21 MED ORDER — LACTATED RINGERS IV SOLN
INTRAVENOUS | Status: DC
  Administered 2018-05-21 (×3): via INTRAVENOUS

## 2018-05-21 MED ORDER — PENICILLIN G 3 MILLION UNITS IVPB - SIMPLE MED
3.0000 10*6.[IU] | INTRAVENOUS | Status: DC
  Administered 2018-05-21 (×2): 3 10*6.[IU] via INTRAVENOUS
  Filled 2018-05-21: qty 3
  Filled 2018-05-21 (×5): qty 100
  Filled 2018-05-21: qty 3
  Filled 2018-05-21: qty 100

## 2018-05-21 MED ORDER — LACTATED RINGERS IV SOLN
500.0000 mL | INTRAVENOUS | Status: DC | PRN
  Administered 2018-05-21: 500 mL via INTRAVENOUS

## 2018-05-21 MED ORDER — PHENYLEPHRINE 40 MCG/ML (10ML) SYRINGE FOR IV PUSH (FOR BLOOD PRESSURE SUPPORT)
80.0000 ug | PREFILLED_SYRINGE | INTRAVENOUS | Status: DC | PRN
  Administered 2018-05-21: 80 ug via INTRAVENOUS
  Filled 2018-05-21: qty 5

## 2018-05-21 MED ORDER — LACTATED RINGERS IV SOLN
500.0000 mL | Freq: Once | INTRAVENOUS | Status: AC
  Administered 2018-05-21: 500 mL via INTRAVENOUS

## 2018-05-21 MED ORDER — IBUPROFEN 600 MG PO TABS
600.0000 mg | ORAL_TABLET | Freq: Four times a day (QID) | ORAL | Status: DC
  Administered 2018-05-21 – 2018-05-23 (×7): 600 mg via ORAL
  Filled 2018-05-21 (×7): qty 1

## 2018-05-21 MED ORDER — OXYTOCIN 40 UNITS IN LACTATED RINGERS INFUSION - SIMPLE MED
2.5000 [IU]/h | INTRAVENOUS | Status: DC
  Administered 2018-05-21: 2.5 [IU]/h via INTRAVENOUS

## 2018-05-21 MED ORDER — SOD CITRATE-CITRIC ACID 500-334 MG/5ML PO SOLN: 30.0000 mL | ORAL | Status: DC | PRN

## 2018-05-21 MED ORDER — ONDANSETRON HCL 4 MG/2ML IJ SOLN: 4.0000 mg | INTRAMUSCULAR | Status: DC | PRN

## 2018-05-21 MED ORDER — EPHEDRINE 5 MG/ML INJ
10.0000 mg | INTRAVENOUS | Status: DC | PRN
  Filled 2018-05-21: qty 2
  Filled 2018-05-21: qty 4

## 2018-05-21 MED ORDER — FENTANYL CITRATE (PF) 100 MCG/2ML IJ SOLN
100.0000 ug | INTRAMUSCULAR | Status: DC | PRN
  Administered 2018-05-21 (×3): 100 ug via INTRAVENOUS
  Filled 2018-05-21 (×3): qty 2

## 2018-05-21 MED ORDER — SIMETHICONE 80 MG PO CHEW: 80.0000 mg | CHEWABLE_TABLET | ORAL | Status: DC | PRN

## 2018-05-21 MED ORDER — COCONUT OIL OIL
1.0000 "application " | TOPICAL_OIL | Status: DC | PRN
  Administered 2018-05-21: 1 via TOPICAL
  Filled 2018-05-21: qty 120

## 2018-05-21 MED ORDER — OXYCODONE-ACETAMINOPHEN 5-325 MG PO TABS: 1.0000 | ORAL_TABLET | ORAL | Status: DC | PRN

## 2018-05-21 MED ORDER — OXYTOCIN 10 UNIT/ML IJ SOLN
10.0000 [IU] | Freq: Once | INTRAMUSCULAR | Status: DC | PRN
  Filled 2018-05-21: qty 1

## 2018-05-21 MED ORDER — TERBUTALINE SULFATE 1 MG/ML IJ SOLN: 0.2500 mg | Freq: Once | INTRAMUSCULAR | Status: DC | PRN

## 2018-05-21 MED ORDER — FENTANYL 2.5 MCG/ML BUPIVACAINE 1/10 % EPIDURAL INFUSION (WH - ANES)
14.0000 mL/h | INTRAMUSCULAR | Status: DC | PRN
  Administered 2018-05-21: 14 mL/h via EPIDURAL
  Filled 2018-05-21: qty 100

## 2018-05-21 MED ORDER — WITCH HAZEL-GLYCERIN EX PADS: 1.0000 "application " | MEDICATED_PAD | CUTANEOUS | Status: DC | PRN

## 2018-05-21 MED ORDER — PHENYLEPHRINE 40 MCG/ML (10ML) SYRINGE FOR IV PUSH (FOR BLOOD PRESSURE SUPPORT)
80.0000 ug | PREFILLED_SYRINGE | INTRAVENOUS | Status: DC | PRN
  Filled 2018-05-21: qty 5
  Filled 2018-05-21: qty 10

## 2018-05-21 MED ORDER — EPHEDRINE 5 MG/ML INJ
10.0000 mg | INTRAVENOUS | Status: DC | PRN
  Filled 2018-05-21: qty 2

## 2018-05-21 MED ORDER — DIPHENHYDRAMINE HCL 25 MG PO CAPS: 25.0000 mg | ORAL_CAPSULE | Freq: Four times a day (QID) | ORAL | Status: DC | PRN

## 2018-05-21 MED ORDER — ONDANSETRON HCL 4 MG PO TABS: 4.0000 mg | ORAL_TABLET | ORAL | Status: DC | PRN

## 2018-05-21 MED ORDER — EPHEDRINE 5 MG/ML INJ: 10.0000 mg | INTRAVENOUS | Status: DC | PRN

## 2018-05-21 MED ORDER — DIBUCAINE 1 % RE OINT: 1.0000 "application " | TOPICAL_OINTMENT | RECTAL | Status: DC | PRN

## 2018-05-21 MED ORDER — OXYTOCIN 40 UNITS IN LACTATED RINGERS INFUSION - SIMPLE MED
1.0000 m[IU]/min | INTRAVENOUS | Status: DC
  Administered 2018-05-21: 2 m[IU]/min via INTRAVENOUS
  Filled 2018-05-21: qty 1000

## 2018-05-21 MED ORDER — BENZOCAINE-MENTHOL 20-0.5 % EX AERO
1.0000 "application " | INHALATION_SPRAY | CUTANEOUS | Status: DC | PRN
  Administered 2018-05-21: 1 via TOPICAL
  Filled 2018-05-21: qty 56

## 2018-05-21 MED ORDER — ZOLPIDEM TARTRATE 5 MG PO TABS: 5.0000 mg | ORAL_TABLET | Freq: Every evening | ORAL | Status: DC | PRN

## 2018-05-21 MED ORDER — PRENATAL MULTIVITAMIN CH
1.0000 | ORAL_TABLET | Freq: Every day | ORAL | Status: DC
  Administered 2018-05-22 – 2018-05-23 (×2): 1 via ORAL
  Filled 2018-05-21 (×2): qty 1

## 2018-05-21 MED ORDER — LIDOCAINE HCL (PF) 1 % IJ SOLN
30.0000 mL | INTRAMUSCULAR | Status: DC | PRN
  Filled 2018-05-21: qty 30

## 2018-05-21 MED ORDER — DIPHENHYDRAMINE HCL 50 MG/ML IJ SOLN: 12.5000 mg | INTRAMUSCULAR | Status: DC | PRN

## 2018-05-21 MED ORDER — OXYCODONE-ACETAMINOPHEN 5-325 MG PO TABS: 2.0000 | ORAL_TABLET | ORAL | Status: DC | PRN

## 2018-05-21 MED ORDER — TETANUS-DIPHTH-ACELL PERTUSSIS 5-2.5-18.5 LF-MCG/0.5 IM SUSP: 0.5000 mL | Freq: Once | INTRAMUSCULAR | Status: DC

## 2018-05-21 MED ORDER — ACETAMINOPHEN 325 MG PO TABS
650.0000 mg | ORAL_TABLET | ORAL | Status: DC | PRN
  Administered 2018-05-22 – 2018-05-23 (×3): 650 mg via ORAL

## 2018-05-21 MED ORDER — LIDOCAINE HCL (PF) 1 % IJ SOLN
INTRAMUSCULAR | Status: DC | PRN
  Administered 2018-05-21: 5 mL via EPIDURAL

## 2018-05-21 MED ORDER — PHENYLEPHRINE 40 MCG/ML (10ML) SYRINGE FOR IV PUSH (FOR BLOOD PRESSURE SUPPORT): 80.0000 ug | PREFILLED_SYRINGE | INTRAVENOUS | Status: DC | PRN

## 2018-05-21 MED ORDER — SODIUM CHLORIDE 0.9 % IV SOLN
5.0000 10*6.[IU] | Freq: Once | INTRAVENOUS | Status: AC
  Administered 2018-05-21: 5 10*6.[IU] via INTRAVENOUS
  Filled 2018-05-21: qty 5

## 2018-05-21 MED ORDER — SENNOSIDES-DOCUSATE SODIUM 8.6-50 MG PO TABS
2.0000 | ORAL_TABLET | ORAL | Status: DC
  Administered 2018-05-21 – 2018-05-23 (×2): 2 via ORAL
  Filled 2018-05-21 (×2): qty 2

## 2018-05-21 MED ORDER — LACTATED RINGERS IV SOLN: 500.0000 mL | Freq: Once | INTRAVENOUS | Status: DC

## 2018-05-21 MED ORDER — OXYTOCIN BOLUS FROM INFUSION
500.0000 mL | Freq: Once | INTRAVENOUS | Status: AC
  Administered 2018-05-21: 500 mL via INTRAVENOUS

## 2018-05-21 MED ORDER — ACETAMINOPHEN 325 MG PO TABS
650.0000 mg | ORAL_TABLET | ORAL | Status: DC | PRN
  Filled 2018-05-21 (×2): qty 2

## 2018-05-21 NOTE — Anesthesia Procedure Notes (Signed)
Epidural Patient location during procedure: OB Start time: 05/21/2018 10:24 AM End time: 05/21/2018 10:42 AM  Staffing Anesthesiologist: Trevor Iha, MD Performed: anesthesiologist   Preanesthetic Checklist Completed: patient identified, site marked, surgical consent, pre-op evaluation, timeout performed, IV checked, risks and benefits discussed and monitors and equipment checked  Epidural Patient position: sitting Prep: site prepped and draped and DuraPrep Patient monitoring: continuous pulse ox and blood pressure Approach: midline Location: L2-L3 Injection technique: LOR air  Needle:  Needle type: Tuohy  Needle gauge: 17 G Needle length: 9 cm and 9 Needle insertion depth: 9 cm Catheter type: closed end flexible Catheter size: 19 Gauge Catheter at skin depth: 14 cm Test dose: negative  Assessment Events: blood not aspirated, injection not painful, no injection resistance, negative IV test and no paresthesia  Additional Notes 2 attempts. Pt tolerated procedure well

## 2018-05-21 NOTE — Progress Notes (Signed)
[name] is a 22 y.o. G1P0 at [redacted]w[redacted]d by ultrasound admitted for PROM  Subjective:   Objective: BP 120/62   Pulse 76   Temp 97.7 F (36.5 C) (Oral)   Resp 18   Ht 5' 8.5" (1.74 m)   Wt 116.1 kg   LMP 07/10/2017   SpO2 98%   BMI 38.36 kg/m  No intake/output data recorded. Total I/O In: 1875.4 [P.O.:60; I.V.:1815.4] Out: -   FHT:  FHR: 120's bpm, variability: moderate,  accelerations:  Present,  decelerations:  Absent UC:   irregular, every 5-7 minutes mild intensity SVE:   Dilation: 3.5 Effacement (%): 70 Station: -2 Exam by:: Marcelino Duster, RN   Labs: Lab Results  Component Value Date   WBC 8.4 05/21/2018   HGB 10.6 (L) 05/21/2018   HCT 33.0 (L) 05/21/2018   MCV 75.3 (L) 05/21/2018   PLT 271 05/21/2018    Assessment / Plan: Augmentation of labor, progressing well  Labor: yet to be in labor Preeclampsia:  no signs or symptoms of toxicity Fetal Wellbeing:  Category I Pain Control:  Labor support without medications I/D:  n/a Anticipated MOD:  NSVD  Wyvonnia Dusky 05/21/2018, 1:01 PM

## 2018-05-21 NOTE — Consult Note (Signed)
[facility]  Prenatal Consult       05/21/2018  3:04 PM   I was asked by Dickey Gave to consult on this patient for possible preterm delivery.  I had the pleasure of meeting with Ms. Denmark, her partner, and her mother today.  She is a 22 y.o. female G1 at [redacted] weeks gestation who presented with PPROM and PTL.  Pregnancy has been complicated by threatened preterm labor and Rh neg (received Rhogam).  She did receive betamethason on 8/16-17.  I explained that the neonatal intensive care team would be present for the delivery and outlined the likely delivery room course for this baby including routine resuscitation and NRP-guided approaches to the treatment of respiratory distress. We discussed other common problems associated with prematurity including respiratory distress syndrome, apnea, feeding issues, temperature regulation, and infection risk.   We discussed the average length of stay but I noted that the actual LOS would depend on the severity of problems encountered and response to treatments.  We discussed visitation policies and the resources available while her child is in the hospital.  We discussed the importance of good nutrition and various methods of providing nutrition (parenteral hyperalimentation, gavage feedings and/or oral feeding). We discussed the benefits of human milk. I encouraged breast feeding and pumping soon after birth and outlined resources that are available to support breast feeding.    Thank you for involving [place] in the care of this patient. A member of our team will be available should the family have additional questions.  Time for consultation approximately 20 minutes.   _____________________ Electronically Signed By: Karie Schwalbe, MD, MS Neonatologist

## 2018-05-21 NOTE — MAU Provider Note (Addendum)
S: Ms. [name] is a 22 y.o. G1P0 at [redacted]w[redacted]d  who presents to MAU today complaining of leaking of fluid since 0100. She denies vaginal bleeding. She endorses contractions since coming to [facility]. She reports normal fetal movement.    O: BP 117/71   Pulse 92   Temp 97.9 F (36.6 C)   Resp 18   LMP 07/10/2017   SpO2 100%  GENERAL: Well-developed, well-nourished female in no acute distress.  HEAD: Normocephalic, atraumatic.  CHEST: Normal effort of breathing, regular heart rate ABDOMEN: Soft, nontender, gravid PELVIC: Normal external female genitalia. Vagina is pink and rugated. Cervix with normal contour, no lesions. Perineum and bed sheets are saturated with copious amounts of clear fluid.   Cervical exam:  Dilation: 3 Effacement (%): 70 Station: -3 Presentation: Vertex Exam by:: Carloyn Jaeger, CNM GBS swab obtained for PCR testing  Fetal Monitoring: Baseline: 125 Variability: moderate Accelerations: present  Decelerations: absent Contractions: regular every 3-4 mins  Results for orders placed or performed during the hospital encounter of 05/21/18 (from the past 24 hour(s))  Fern Test     Status: Abnormal   Collection Time: 05/21/18  2:00 AM  Result Value Ref Range   POCT Fern Test Positive = ruptured amniotic membanes   CBC     Status: Abnormal   Collection Time: 05/21/18  2:38 AM  Result Value Ref Range   WBC 8.4 4.0 - 10.5 K/uL   RBC 4.38 3.87 - 5.11 MIL/uL   Hemoglobin 10.6 (L) 12.0 - 15.0 g/dL   HCT 21.2 (L) 24.8 - 25.0 %   MCV 75.3 (L) 78.0 - 100.0 fL   MCH 24.2 (L) 26.0 - 34.0 pg   MCHC 32.1 30.0 - 36.0 g/dL   RDW 03.7 (H) 04.8 - 88.9 %   Platelets 271 150 - 400 K/uL  Type and screen [facility]      Status: None (Preliminary result)   Collection Time: 05/21/18  2:38 AM  Result Value Ref Range   ABO/RH(D) O NEG    Antibody Screen PENDING    Sample Expiration      05/24/2018 Performed at [facility], [address], [place], [place]  [address]    *Consult with Dr. Jamie Brookes @ [phone] - notified of patient's need for admission d/t PPROM, BMX rcv'd on 8/16 & 8/17; ok to admit to L&D here  A: [facility] at [redacted]w[redacted]d  PPROM  P: Admit to L&D Routine L&D admission orders Philipp Deputy, CNM on L&D notified of admission  Raelyn Mora, CNM 05/21/2018, 3:44 AM

## 2018-05-21 NOTE — Progress Notes (Signed)
[name] is a 22 y.o. G1P0 at [redacted]w[redacted]d by ultrasound admitted for rupture of membranes  Subjective:   Objective: BP (!) 101/59   Pulse 92   Temp 98 F (36.7 C) (Oral)   Resp 18   Ht 5' 8.5" (1.74 m)   Wt 116.1 kg   LMP 07/10/2017   SpO2 99%   BMI 38.36 kg/m  No intake/output data recorded. No intake/output data recorded.  FHT:  FHR: 120's bpm, variability: moderate,  accelerations:  Present,  decelerations:  Present variable UC:   irregular, every 6-7 minutes SVE:   Dilation: 3 Effacement (%): 70 Station: -3 Exam by:: Marcelino Duster, RN   Labs: Lab Results  Component Value Date   WBC 8.4 05/21/2018   HGB 10.6 (L) 05/21/2018   HCT 33.0 (L) 05/21/2018   MCV 75.3 (L) 05/21/2018   PLT 271 05/21/2018    Assessment / Plan: Induction of labor due to PROM,  progressing well on pitocin  Labor: will start pit augmentation Preeclampsia:  no signs or symptoms of toxicity Fetal Wellbeing:  Category I Pain Control:  Labor support without medications I/D:  n/a Anticipated MOD:  NSVD  [name] 05/21/2018, 10:29 AM

## 2018-05-21 NOTE — H&P (Signed)
[name] is a 22 y.o. female G1 @ 34.1wks presenting for leaking fluid since 0100. Having some ctx; denies bldg, H/A, N/V or visual disturbances. Rec'd BMZ on 8/16 and 8/17 due to concern for PTL. Her preg has been followed by the [facility] service and has been essentially unremarkable other than some preterm labor and Rh negative status.  OB History    Gravida  1   Para      Term      Preterm      AB      Living        SAB      TAB      Ectopic      Multiple      Live Births             Past Medical History:  Diagnosis Date  . Medical history non-contributory    Past Surgical History:  Procedure Laterality Date  . UMBILICAL HERNIA REPAIR     Family History: family history includes Cancer in her father and other; Diabetes in her other. Social History:  reports that she has never smoked. She has never used smokeless tobacco. She reports that she does not drink alcohol or use drugs.     Maternal Diabetes: No Genetic Screening: Normal Maternal Ultrasounds/Referrals: Normal Fetal Ultrasounds or other Referrals:  None Maternal Substance Abuse:  No Significant Maternal Medications:  None Significant Maternal Lab Results:  Lab values include: Rh negative, Other: GBS PCR positive Other Comments:  None  ROS History Dilation: 3 Effacement (%): 70 Station: -3 Exam by:: R Dawson Blood pressure 117/71, pulse 92, temperature 97.9 F (36.6 C), resp. rate 18, height 5' 8.5" (1.74 m), weight 116.1 kg, last menstrual period 07/10/2017, SpO2 100 %. Exam Physical Exam  Constitutional: She is oriented to person, place, and time. She appears well-developed.  HENT:  Head: Normocephalic.  Neck: Normal range of motion.  Cardiovascular: Normal rate.  Respiratory: Effort normal.  GI:  EFM 120-130s, +accels, no decels Ctx irreg, mild  Musculoskeletal: Normal range of motion.  Neurological: She is alert and oriented to person, place, and time.  Skin: Skin is warm and  dry.  Psychiatric: She has a normal mood and affect. Her behavior is normal. Thought content normal.    Prenatal labs: ABO, Rh: --/--/O NEG (09/01 0238) Antibody: POS (09/01 0238) Rubella: Immune (04/05 0000) RPR: Non Reactive (07/19 1058)  HBsAg: Negative (04/05 0000)  HIV: Non Reactive (07/19 1058)  GBS:   PCR, positive 05/21/18  Assessment/Plan: IUP@34 .1wks PPROM GBS PCR pos  [facility] to [facility] for GBS ppx Expectant management to start with; may need to augment if no cervical change  Rhogam eval pp  Cam Hai CNM 05/21/2018, 4:11 AM

## 2018-05-21 NOTE — Anesthesia Pain Management Evaluation Note (Signed)
  CRNA Pain Management Visit Note  Patient: [name], 22 y.o., female  "Hello I am a member of the anesthesia team at [facility]. We have an anesthesia team available at all times to provide care throughout the hospital, including epidural management and anesthesia for C-section. I don't know your plan for the delivery whether it a natural birth, water birth, IV sedation, nitrous supplementation, doula or epidural, but we want to meet your pain goals."   1.Was your pain managed to your expectations on prior hospitalizations?   No prior hospitalizations  2.What is your expectation for pain management during this hospitalization?     Epidural  3.How can we help you reach that goal? epidural  Record the patient's initial score and the patient's pain goal.   Pain: 5  Pain Goal: 3 The [facility] wants you to be able to say your pain was always managed very well.  [facility] 05/21/2018

## 2018-05-21 NOTE — Anesthesia Preprocedure Evaluation (Signed)
Anesthesia Evaluation  Patient identified by MRN, date of birth, ID band Patient awake    Reviewed: Allergy & Precautions, NPO status , Patient's Chart, lab work & pertinent test results  Airway Mallampati: II  TM Distance: >3 FB Neck ROM: Full    Dental no notable dental hx.    Pulmonary neg pulmonary ROS,    Pulmonary exam normal breath sounds clear to auscultation       Cardiovascular Exercise Tolerance: Good Normal cardiovascular exam Rhythm:Regular Rate:Normal     Neuro/Psych negative neurological ROS  negative psych ROS   GI/Hepatic   Endo/Other    Renal/GU      Musculoskeletal   Abdominal   Peds  Hematology   Anesthesia Other Findings Pt 34 wks PPROM  Reproductive/Obstetrics (+) Pregnancy                             Lab Results  Component Value Date   WBC 8.4 05/21/2018   HGB 10.6 (L) 05/21/2018   HCT 33.0 (L) 05/21/2018   MCV 75.3 (L) 05/21/2018   PLT 271 05/21/2018    Anesthesia Physical Anesthesia Plan  ASA: II  Anesthesia Plan: Epidural   Post-op Pain Management:    Induction:   PONV Risk Score and Plan:   Airway Management Planned:   Additional Equipment:   Intra-op Plan:   Post-operative Plan:   Informed Consent: I have reviewed the patients History and Physical, chart, labs and discussed the procedure including the risks, benefits and alternatives for the proposed anesthesia with the patient or authorized representative who has indicated his/her understanding and acceptance.     Plan Discussed with:   Anesthesia Plan Comments:         Anesthesia Quick Evaluation

## 2018-05-21 NOTE — MAU Note (Signed)
Pt reports LOF since 0100. Pt denies pain. Pt last felt FM @ 0000

## 2018-05-22 LAB — CBC
HEMATOCRIT: 31 % — AB (ref 36.0–46.0)
HEMOGLOBIN: 9.7 g/dL — AB (ref 12.0–15.0)
MCH: 24 pg — AB (ref 26.0–34.0)
MCHC: 31.3 g/dL (ref 30.0–36.0)
MCV: 76.5 fL — AB (ref 78.0–100.0)
Platelets: 236 10*3/uL (ref 150–400)
RBC: 4.05 MIL/uL (ref 3.87–5.11)
RDW: 16.5 % — ABNORMAL HIGH (ref 11.5–15.5)
WBC: 10.7 10*3/uL — ABNORMAL HIGH (ref 4.0–10.5)

## 2018-05-22 MED ORDER — RHO D IMMUNE GLOBULIN 1500 UNIT/2ML IJ SOSY
300.0000 ug | PREFILLED_SYRINGE | Freq: Once | INTRAMUSCULAR | Status: AC
  Administered 2018-05-22: 300 ug via INTRAVENOUS
  Filled 2018-05-22: qty 2

## 2018-05-22 NOTE — Anesthesia Postprocedure Evaluation (Signed)
Anesthesia Post Note  Patient: [name]  Procedure(s) Performed: AN AD HOC LABOR EPIDURAL     Patient location during evaluation: [facility] Anesthesia Type: Epidural Level of consciousness: awake, awake and alert, oriented and patient cooperative Pain management: pain level controlled Vital Signs Assessment: post-procedure vital signs reviewed and stable Respiratory status: spontaneous breathing, nonlabored ventilation and respiratory function stable Cardiovascular status: stable Postop Assessment: no headache, no backache, patient able to bend at knees and no apparent nausea or vomiting Anesthetic complications: no    Last Vitals:  Vitals:   05/21/18 2336 05/22/18 0408  BP: 117/61 (!) 106/55  Pulse: 85 79  Resp: 18 16  Temp: 37 C 36.9 C  SpO2: 100% 99%    Last Pain:  Vitals:   05/22/18 0640  TempSrc:   PainSc: Asleep   Pain Goal: Patients Stated Pain Goal: 2 (05/22/18 0541)               Boyd Litaker L

## 2018-05-22 NOTE — Progress Notes (Signed)
Patient screened out for psychosocial assessment since none of the following apply: °Psychosocial stressors documented in mother or baby's chart °Gestation less than 32 weeks °Code at delivery  °Infant with anomalies °Please contact the Clinical Social Worker if specific needs arise, by MOB's request, or if MOB scores greater than 9/yes to question 10 on Edinburgh Postpartum Depression Screen. ° °Manaal Mandala Boyd-Gilyard, MSW, LCSW °Clinical Social Work °[phone] °  °

## 2018-05-22 NOTE — Lactation Note (Addendum)
This note was copied from a baby's chart. Lactation Consultation Note  Patient Name: [name] Today's Date: 05/22/2018 Reason for consult: Initial assessment;NICU baby;Preterm <34wks;Primapara;1st time breastfeeding  Baby is 20 hours old.  LC visited  Mom in her room 305, resting in bed, grandmother and dad present.  Per mom since the pump was set up has pumped and got some small results.  LC reviewed supply and demand and the importance of consistent pumping and  Increasing the amount of pumping per 24 hours . LC explained the pumping goal  Of 8-10 x's in 24 hours to establish and protect milk supply.  Also mom mentioned she was shown how to hand express. LC enc prior to pumping and Afterwards.  Per mom has [employer] [employer] and pregnancy medicaid. And would like to be signed up for [facility].  LC recommended to call [employer] in am and check for coverage of breast pump.  Also LC asked mom for her permission to fax a request form to [facility] / [place]. Mom consented, and LC faxed form for [facility] loaner DEBP request.  Mother informed of post-discharge support and given phone number to the lactation department, including services for phone call assistance; out-patient appointments; and breastfeeding support group. List of other breastfeeding resources in the community given in the handout. Encouraged mother to call for problems or concerns related to breastfeeding. LC faxed a DEBP WIC referral to [place] [facility] today 9/2    Maternal Data Has patient been taught Hand Expression?: Yes(per mom has been shown by the RN ) Does the patient have breastfeeding experience prior to this delivery?: No  Feeding Feeding Type: Donor Breast Milk(with breastmilk)  LATCH Score                   Interventions Interventions: Breast feeding basics reviewed;DEBP  Lactation Tools Discussed/Used Tools: Pump Breast pump type: Double-Electric Breast Pump WIC Program: No(per mom would like to sign up / LC  faxed a request ) Pump Review: Milk Storage Initiated by:: by the RN    Consult Status Consult Status: Follow-up Date: 05/23/18 Follow-up type: In-patient    Matilde Sprang Maycol Hoying 05/22/2018, 1:21 PM

## 2018-05-22 NOTE — Progress Notes (Signed)
Post Partum Day 1 Subjective: no complaints  Objective: Blood pressure (!) 106/55, pulse 79, temperature 98.5 F (36.9 C), temperature source Oral, resp. rate 16, height 5' 8.5" (1.74 m), weight 116.1 kg, last menstrual period 07/10/2017, SpO2 99 %, unknown if currently breastfeeding.  Physical Exam:  General: alert, cooperative and no distress Lochia: appropriate Uterine Fundus: firm Incision: none DVT Evaluation: No evidence of DVT seen on physical exam.  Recent Labs    05/21/18 0238 05/22/18 0529  HGB 10.6* 9.7*  HCT 33.0* 31.0*    Assessment/Plan: Breastfeeding. Baby in NICU but doing well   LOS: 1 day   Scheryl Darter 05/22/2018, 7:59 AM

## 2018-05-23 ENCOUNTER — Other Ambulatory Visit: Payer: Self-pay

## 2018-05-23 LAB — RH IG WORKUP (INCLUDES ABO/RH)
ABO/RH(D): O NEG
Fetal Screen: NEGATIVE
Gestational Age(Wks): 34
UNIT DIVISION: 0

## 2018-05-23 MED ORDER — BREAST PUMP MISC: 1.0000 | 0 refills | Status: AC | PRN

## 2018-05-23 MED ORDER — NORETHIN ACE-ETH ESTRAD-FE 1.5-30 MG-MCG PO TABS: 1.0000 | ORAL_TABLET | Freq: Every day | ORAL | 11 refills | Status: DC

## 2018-05-23 NOTE — Lactation Note (Signed)
This note was copied from a baby's chart. Lactation Consultation Note  Patient Name: Boy Kimani Bedoya Today's Date: 05/23/2018 Reason for consult: Follow-up assessment   Maternal Data  baby boy 45 hours.  Mom being discharged today.  Does not have DEBP for home use yet.  Has private insurance and Medicaid.  Not on [facility].  Plans to rent pump at [employer] for 1 month until she can get pump from [facility].  Denies questions/concerns.  Urged mom to do sts and pump at baby's bedside or pump room in NICU. Reminded mom  To take all pump parts home with her.  Showed her the manual pump in her kit and how to use pump as both double and single manual pump. Mom had pump parts soaking in soapy water.  Discussed washing part past use and if didn;t have time to wash just to soak in plain water.  Discussed sterilization of pump parts at home and ways to sterilize them.  Mom to call as needed. Has Breastfeeding resource list and [facility] Breastfeeding Handout.  Mom also knows she can ask for lactation in the NICU when she comes to visit if any questions/concerns.  Feeding    LATCH Score                   Interventions    Lactation Tools Discussed/Used     Consult Status Consult Status: Complete Date: 05/23/18 Follow-up type: Call as needed    [facility] 05/23/2018, 12:59 PM

## 2018-05-23 NOTE — Discharge Summary (Signed)
Physician Discharge Summary  Patient ID: [name] MRN: [id number] DOB/AGE: 1996/03/16 22 y.o.  Admit date: 05/21/2018 Discharge date: 05/23/2018   Discharge Diagnoses:  Principal Problem:   Preterm premature rupture of membranes (PPROM) with unknown onset of labor Active Problems:   Supervision of normal first pregnancy, antepartum   Rh negative state in antepartum period   Obesity affecting pregnancy in second trimester   Threatened preterm labor, antepartum   Hospital Course: Please see HPI dated 05/21/2018 for details. This is a 22 y.o. G1P0101 now PPD#2 from a spontaneous vaginal delivery. She presented in spontaneous labor with PPROM at [redacted]w[redacted]d. Her antepartum course was uncomplicated aside from pre-term labor for which she received BTMZ 8/16-17. Her post partum course was uncomplicated. By day 2, she was ambulating, passing flatus and pain was well controlled. She was discharged home PPD#2 in good condition. Baby remains in NICU and is doing well. Instructions for follow up given.   Had long conversation regarding contraception, she desires OCPs. Reviewed risks/benefits of contraception including LARCs, she only wants OCPs. Recommended she start OCPs 6 weeks post partum.  Physical exam  Vitals:   05/22/18 1949 05/23/18 0046 05/23/18 0519 05/23/18 0838  BP: (!) 112/55 112/73 (!) 106/55 115/77  Pulse: 79 71 65 79  Resp: 17 18 16 18   Temp: 98.3 F (36.8 C) 97.9 F (36.6 C) 97.9 F (36.6 C) (!) 97.5 F (36.4 C)  TempSrc: Oral  Oral Oral  SpO2: 99% 100% 100% 96%  Weight:      Height:       Physical Exam:  General: alert, oriented, cooperative Chest: CTAB, normal respiratory effort Heart: RRR  Abdomen: +BS, soft, appropriately tender to palpation  Uterine Fundus: firm, 2 fingers below the umbilicus Lochia: moderate, rubra DVT Evaluation: no evidence of DVT Extremities: no edema, no calf tenderness   Postpartum contraception: Combination OCPs  Disposition:  home  Discharged Condition: good  Discharge Instructions    Call MD for:  difficulty breathing, headache or visual disturbances   Complete by:  As directed    Call MD for:  extreme fatigue   Complete by:  As directed    Call MD for:  hives   Complete by:  As directed    Call MD for:  persistant dizziness or light-headedness   Complete by:  As directed    Call MD for:  persistant nausea and vomiting   Complete by:  As directed    Call MD for:  redness, tenderness, or signs of infection (pain, swelling, redness, odor or green/yellow discharge around incision site)   Complete by:  As directed    Call MD for:  severe uncontrolled pain   Complete by:  As directed    Call MD for:  temperature >100.4   Complete by:  As directed    Diet - low sodium heart healthy   Complete by:  As directed      Allergies as of 05/23/2018   No Known Allergies     Medication List    STOP taking these medications   acetaminophen 500 MG tablet Commonly known as:  TYLENOL   PRENATE PIXIE 10-0.6-0.4-200 MG Caps     TAKE these medications   norethindrone-ethinyl estradiol-iron 1.5-30 MG-MCG tablet Commonly known as:  MICROGESTIN FE,GILDESS FE,LOESTRIN FE Take 1 tablet by mouth daily. Start 6 weeks after delivery.      Follow-up Information    [facility]. Schedule an appointment as soon as  possible for a visit in 4 week(s).   Specialty:  Obstetrics and Gynecology Contact information: [address] [place] [address] [phone]          Signed: Baldemar Lenis, M.D. [facility]  05/23/2018, 10:53 AM

## 2018-05-23 NOTE — Discharge Instructions (Signed)
Etonogestrel implant What is this medicine? ETONOGESTREL (et oh noe JES trel) is a contraceptive (birth control) device. It is used to prevent pregnancy. It can be used for up to 3 years. This medicine may be used for other purposes; ask your health care provider or pharmacist if you have questions. COMMON BRAND NAME(S): Implanon, Nexplanon What should I tell my health care provider before I take this medicine? They need to know if you have any of these conditions: -abnormal vaginal bleeding -blood vessel disease or blood clots -cancer of the breast, cervix, or liver -depression -diabetes -gallbladder disease -headaches -heart disease or recent heart attack -high blood pressure -high cholesterol -kidney disease -liver disease -renal disease -seizures -tobacco smoker -an unusual or allergic reaction to etonogestrel, other hormones, anesthetics or antiseptics, medicines, foods, dyes, or preservatives -pregnant or trying to get pregnant -breast-feeding How should I use this medicine? This device is inserted just under the skin on the inner side of your upper arm by a health care professional. Talk to your pediatrician regarding the use of this medicine in children. Special care may be needed. Overdosage: If you think you have taken too much of this medicine contact a poison control center or emergency room at once. NOTE: This medicine is only for you. Do not share this medicine with others. What if I miss a dose? This does not apply. What may interact with this medicine? Do not take this medicine with any of the following medications: -amprenavir -bosentan -fosamprenavir This medicine may also interact with the following medications: -barbiturate medicines for inducing sleep or treating seizures -certain medicines for fungal infections like ketoconazole and itraconazole -grapefruit juice -griseofulvin -medicines to treat seizures like carbamazepine, felbamate, oxcarbazepine,  phenytoin, topiramate -modafinil -phenylbutazone -rifampin -rufinamide -some medicines to treat HIV infection like atazanavir, indinavir, lopinavir, nelfinavir, tipranavir, ritonavir -St. John's wort This list may not describe all possible interactions. Give your health care provider a list of all the medicines, herbs, non-prescription drugs, or dietary supplements you use. Also tell them if you smoke, drink alcohol, or use illegal drugs. Some items may interact with your medicine. What should I watch for while using this medicine? This product does not protect you against HIV infection (AIDS) or other sexually transmitted diseases. You should be able to feel the implant by pressing your fingertips over the skin where it was inserted. Contact your doctor if you cannot feel the implant, and use a non-hormonal birth control method (such as condoms) until your doctor confirms that the implant is in place. If you feel that the implant may have broken or become bent while in your arm, contact your healthcare provider. What side effects may I notice from receiving this medicine? Side effects that you should report to your doctor or health care professional as soon as possible: -allergic reactions like skin rash, itching or hives, swelling of the face, lips, or tongue -breast lumps -changes in emotions or moods -depressed mood -heavy or prolonged menstrual bleeding -pain, irritation, swelling, or bruising at the insertion site -scar at site of insertion -signs of infection at the insertion site such as fever, and skin redness, pain or discharge -signs of pregnancy -signs and symptoms of a blood clot such as breathing problems; changes in vision; chest pain; severe, sudden headache; pain, swelling, warmth in the leg; trouble speaking; sudden numbness or weakness of the face, arm or leg -signs and symptoms of liver injury like dark yellow or brown urine; general ill feeling or flu-like symptoms;  light-colored  stools; loss of appetite; nausea; right upper belly pain; unusually weak or tired; yellowing of the eyes or skin -unusual vaginal bleeding, discharge -signs and symptoms of a stroke like changes in vision; confusion; trouble speaking or understanding; severe headaches; sudden numbness or weakness of the face, arm or leg; trouble walking; dizziness; loss of balance or coordination Side effects that usually do not require medical attention (report to your doctor or health care professional if they continue or are bothersome): -acne -back pain -breast pain -changes in weight -dizziness -general ill feeling or flu-like symptoms -headache -irregular menstrual bleeding -nausea -sore throat -vaginal irritation or inflammation This list may not describe all possible side effects. Call your doctor for medical advice about side effects. You may report side effects to FDA at 1-800-FDA-1088. Where should I keep my medicine? This drug is given in a hospital or clinic and will not be stored at home. NOTE: This sheet is a summary. It may not cover all possible information. If you have questions about this medicine, talk to your doctor, pharmacist, or health care provider.  2018 Elsevier/Gold Standard (2016-03-25 11:19:22) Intrauterine Device Information An intrauterine device (IUD) is inserted into your uterus to prevent pregnancy. There are two types of IUDs available:  Copper IUD--This type of IUD is wrapped in copper wire and is placed inside the uterus. Copper makes the uterus and fallopian tubes produce a fluid that kills sperm. The copper IUD can stay in place for 10 years.  Hormone IUD--This type of IUD contains the hormone progestin (synthetic progesterone). The hormone thickens the cervical mucus and prevents sperm from entering the uterus. It also thins the uterine lining to prevent implantation of a fertilized egg. The hormone can weaken or kill the sperm that get into the uterus.  One type of hormone IUD can stay in place for 5 years, and another type can stay in place for 3 years.  Your health care provider will make sure you are a good candidate for a contraceptive IUD. Discuss with your health care provider the possible side effects. Advantages of an intrauterine device  IUDs are highly effective, reversible, long acting, and low maintenance.  There are no estrogen-related side effects.  An IUD can be used when breastfeeding.  IUDs are not associated with weight gain.  The copper IUD works immediately after insertion.  The hormone IUD works right away if inserted within 7 days of your period starting. You will need to use a backup method of birth control for 7 days if the hormone IUD is inserted at any other time in your cycle.  The copper IUD does not interfere with your female hormones.  The hormone IUD can make heavy menstrual periods lighter and decrease cramping.  The hormone IUD can be used for 3 or 5 years.  The copper IUD can be used for 10 years. Disadvantages of an intrauterine device  The hormone IUD can be associated with irregular bleeding patterns.  The copper IUD can make your menstrual flow heavier and more painful.  You may experience cramping and vaginal bleeding after insertion. This information is not intended to replace advice given to you by your health care provider. Make sure you discuss any questions you have with your health care provider. Document Released: 08/10/2004 Document Revised: 02/12/2016 Document Reviewed: 02/25/2013 Elsevier Interactive Patient Education  2017 Elsevier Inc.   Vaginal Delivery, Care After Refer to this sheet in the next few weeks. These instructions provide you with information about caring for yourself  after vaginal delivery. Your health care provider may also give you more specific instructions. Your treatment has been planned according to current medical practices, but problems sometimes occur.  Call your health care provider if you have any problems or questions. What can I expect after the procedure? After vaginal delivery, it is common to have:  Some bleeding from your vagina.  Soreness in your abdomen, your vagina, and the area of skin between your vaginal opening and your anus (perineum).  Pelvic cramps.  Fatigue.  Follow these instructions at home: Medicines  Take over-the-counter and prescription medicines only as told by your health care provider.  If you were prescribed an antibiotic medicine, take it as told by your health care provider. Do not stop taking the antibiotic until it is finished. Driving   Do not drive or operate heavy machinery while taking prescription pain medicine.  Do not drive for 24 hours if you received a sedative. Lifestyle  Do not drink alcohol. This is especially important if you are breastfeeding or taking medicine to relieve pain.  Do not use tobacco products, including cigarettes, chewing tobacco, or e-cigarettes. If you need help quitting, ask your health care provider. Eating and drinking  Drink at least 8 eight-ounce glasses of water every day unless you are told not to by your health care provider. If you choose to breastfeed your baby, you may need to drink more water than this.  Eat high-fiber foods every day. These foods may help prevent or relieve constipation. High-fiber foods include: ? Whole grain cereals and breads. ? Brown rice. ? Beans. ? Fresh fruits and vegetables. Activity  Return to your normal activities as told by your health care provider. Ask your health care provider what activities are safe for you.  Rest as much as possible. Try to rest or take a nap when your baby is sleeping.  Do not lift anything that is heavier than your baby or 10 lb (4.5 kg) until your health care provider says that it is safe.  Talk with your health care provider about when you can engage in sexual activity. This may depend on  your: ? Risk of infection. ? Rate of healing. ? Comfort and desire to engage in sexual activity. Vaginal Care  If you have an episiotomy or a vaginal tear, check the area every day for signs of infection. Check for: ? More redness, swelling, or pain. ? More fluid or blood. ? Warmth. ? Pus or a bad smell.  Do not use tampons or douches until your health care provider says this is safe.  Watch for any blood clots that may pass from your vagina. These may look like clumps of dark red, brown, or black discharge. General instructions  Keep your perineum clean and dry as told by your health care provider.  Wear loose, comfortable clothing.  Wipe from front to back when you use the toilet.  Ask your health care provider if you can shower or take a bath. If you had an episiotomy or a perineal tear during labor and delivery, your health care provider may tell you not to take baths for a certain length of time.  Wear a bra that supports your breasts and fits you well.  If possible, have someone help you with household activities and help care for your baby for at least a few days after you leave the hospital.  Keep all follow-up visits for you and your baby as told by your health care provider. This  is important. Contact a health care provider if:  You have: ? Vaginal discharge that has a bad smell. ? Difficulty urinating. ? Pain when urinating. ? A sudden increase or decrease in the frequency of your bowel movements. ? More redness, swelling, or pain around your episiotomy or vaginal tear. ? More fluid or blood coming from your episiotomy or vaginal tear. ? Pus or a bad smell coming from your episiotomy or vaginal tear. ? A fever. ? A rash. ? Little or no interest in activities you used to enjoy. ? Questions about caring for yourself or your baby.  Your episiotomy or vaginal tear feels warm to the touch.  Your episiotomy or vaginal tear is separating or does not appear to be  healing.  Your breasts are painful, hard, or turn red.  You feel unusually sad or worried.  You feel nauseous or you vomit.  You pass large blood clots from your vagina. If you pass a blood clot from your vagina, save it to show to your health care provider. Do not flush blood clots down the toilet without having your health care provider look at them.  You urinate more than usual.  You are dizzy or light-headed.  You have not breastfed at all and you have not had a menstrual period for 12 weeks after delivery.  You have stopped breastfeeding and you have not had a menstrual period for 12 weeks after you stopped breastfeeding. Get help right away if:  You have: ? Pain that does not go away or does not get better with medicine. ? Chest pain. ? Difficulty breathing. ? Blurred vision or spots in your vision. ? Thoughts about hurting yourself or your baby.  You develop pain in your abdomen or in one of your legs.  You develop a severe headache.  You faint.  You bleed from your vagina so much that you fill two sanitary pads in one hour. This information is not intended to replace advice given to you by your health care provider. Make sure you discuss any questions you have with your health care provider. Document Released: 09/03/2000 Document Revised: 02/18/2016 Document Reviewed: 09/21/2015 Elsevier Interactive Patient Education  2018 [employer].

## 2018-05-25 LAB — BPAM RBC
BLOOD PRODUCT EXPIRATION DATE: 201910052359
BLOOD PRODUCT EXPIRATION DATE: 201910052359
ISSUE DATE / TIME: 201909021358
Unit Type and Rh: 9500
Unit Type and Rh: 9500

## 2018-05-25 LAB — TYPE AND SCREEN
ABO/RH(D): O NEG
ANTIBODY SCREEN: POSITIVE
UNIT DIVISION: 0
Unit division: 0

## 2018-06-02 ENCOUNTER — Encounter: Payer: 59 | Admitting: Advanced Practice Midwife

## 2018-06-05 ENCOUNTER — Ambulatory Visit: Payer: Self-pay

## 2018-06-05 NOTE — Lactation Note (Signed)
This note was copied from a baby's chart. Lactation Consultation Note  Patient Name: [name] Today's Date: 06/05/2018 Reason for consult: Follow-up assessment;NICU baby Baby roomed in last night.  Mom has an abundant milk supply and is pumping every 3 hours and once at night.  She has attempted to latch baby but states baby won't latch.  Discussed a nipple shield and recommended an outpatient appointment.  Questions answered.  Maternal Data    Feeding    LATCH Score                   Interventions    Lactation Tools Discussed/Used     Consult Status Consult Status: Follow-up Follow-up type: Out-patient    Huston FoleyMOULDEN, Aveon Colquhoun S 06/05/2018, 12:04 PM

## 2018-06-08 ENCOUNTER — Encounter: Payer: Self-pay | Admitting: Obstetrics and Gynecology

## 2018-06-08 ENCOUNTER — Ambulatory Visit (INDEPENDENT_AMBULATORY_CARE_PROVIDER_SITE_OTHER): Payer: 59 | Admitting: Obstetrics and Gynecology

## 2018-06-08 MED ORDER — NORETHINDRONE 0.35 MG PO TABS: 1.0000 | ORAL_TABLET | Freq: Every day | ORAL | 11 refills | Status: AC

## 2018-06-08 NOTE — Progress Notes (Signed)
Post Partum Exam  [name] is a 22 y.o. [id number] female who presents for a postpartum visit. She is 2 weeks postpartum following a spontaneous vaginal delivery. I have fully reviewed the prenatal and intrapartum course. The delivery was at 34 gestational weeks.  Anesthesia: epidural. Postpartum course has been unremarkable. Baby's course has been- baby was in NICU for 2 weeks but is now home. Baby is feeding by breast. Bleeding staining only. Bowel function is normal. Bladder function is normal. Patient is not sexually active. Contraception method is oral contraceptive (hasn't started taking it yet). Postpartum depression screening:neg (score 0)  The following portions of the patient's history were reviewed and updated as appropriate: allergies, current medications, past family history, past medical history, past social history and past surgical history. Last pap smear done 3/19 and was Normal  Review of Systems Pertinent items noted in HPI and remainder of comprehensive ROS otherwise negative.    Objective:  Last menstrual period 07/10/2017, unknown if currently breastfeeding.  General:  alert   Breasts:  not examined  Lungs: clear to auscultation bilaterally  Heart:  regular rate and rhythm, S1, S2 normal, no murmur, click, rub or gallop  Abdomen: soft, non-tender; bowel sounds normal; no masses,  no organomegaly   Vulva:  not evaluated  Vagina: not evaluated  Cervix:  not examined  Corpus: not examined  Adnexa:  not evaluated  Rectal Exam: Not performed.        Assessment:    Nl postpartum exam.   Plan:   1. Contraception: oral progesterone-only contraceptive 2. Return to nl ADL's 3. Follow up in: 1 yr or as needed.

## 2018-06-08 NOTE — Patient Instructions (Signed)

## 2018-06-09 ENCOUNTER — Telehealth (HOSPITAL_COMMUNITY): Payer: Self-pay | Admitting: Lactation Services

## 2018-06-09 NOTE — Telephone Encounter (Signed)
[name] about [facility] supply. Baby born at 34 weeks and was in NICU for 2 weeks. Baby 5320 days old.  Mom primarily pumping and bottle feeding, trying at times to latch baby.  Baby sucks a few times, but then comes off the breast.  [name] concerned that when she was rooming-in with baby earlier this week, she was expressing 2.5-3.5 oz with each pumping.  After baby was discharged, she states that she wasn't able to pump for 1-2 days.  She has started back pumping every 2-3 hrs using a Medela Sonata DEBP.  [name] is concerned that she is only expressing 1-2 oz each time.  (Taking Mother's Milk Tea).  Recommendations- 1- Try to have baby STS on her chest more 2- Offer breast before pumping (try before or after baby's bottle feeding) 3- Breast massage and hand expression each pumping 4-Power pump once a day 5- OP appointment with LC on 06/13/18 6- Call with further questions

## 2018-07-03 ENCOUNTER — Inpatient Hospital Stay (HOSPITAL_COMMUNITY)
Admission: AD | Admit: 2018-07-03 | Discharge: 2018-07-03 | Disposition: A | Discharge status: Home / Self Care | Payer: 59 | Attending: Obstetrics and Gynecology | Admitting: Obstetrics and Gynecology

## 2018-08-14 ENCOUNTER — Encounter (HOSPITAL_BASED_OUTPATIENT_CLINIC_OR_DEPARTMENT_OTHER): Payer: Self-pay | Admitting: Emergency Medicine

## 2018-08-14 ENCOUNTER — Emergency Department (HOSPITAL_BASED_OUTPATIENT_CLINIC_OR_DEPARTMENT_OTHER)
Admission: EM | Admit: 2018-08-14 | Discharge: 2018-08-14 | Disposition: A | Discharge status: Home / Self Care | Payer: 59 | Attending: Emergency Medicine | Admitting: Emergency Medicine

## 2018-08-14 ENCOUNTER — Other Ambulatory Visit: Payer: Self-pay

## 2018-08-14 DIAGNOSIS — Y92410 Unspecified street and highway as the place of occurrence of the external cause: Secondary | ICD-10-CM | POA: Diagnosis not present

## 2018-08-14 DIAGNOSIS — S29012A Strain of muscle and tendon of back wall of thorax, initial encounter: Secondary | ICD-10-CM | POA: Insufficient documentation

## 2018-08-14 DIAGNOSIS — Z79899 Other long term (current) drug therapy: Secondary | ICD-10-CM | POA: Insufficient documentation

## 2018-08-14 DIAGNOSIS — Y9389 Activity, other specified: Secondary | ICD-10-CM | POA: Diagnosis not present

## 2018-08-14 DIAGNOSIS — R51 Headache: Secondary | ICD-10-CM | POA: Diagnosis not present

## 2018-08-14 DIAGNOSIS — S299XXA Unspecified injury of thorax, initial encounter: Secondary | ICD-10-CM | POA: Diagnosis present

## 2018-08-14 DIAGNOSIS — Y999 Unspecified external cause status: Secondary | ICD-10-CM | POA: Diagnosis not present

## 2018-08-14 DIAGNOSIS — T148XXA Other injury of unspecified body region, initial encounter: Secondary | ICD-10-CM

## 2018-08-14 LAB — PREGNANCY, URINE: Preg Test, Ur: NEGATIVE

## 2018-08-14 LAB — CBC WITH DIFFERENTIAL/PLATELET
ABS IMMATURE GRANULOCYTES: 0.02 10*3/uL (ref 0.00–0.07)
BASOS PCT: 0 %
Basophils Absolute: 0 10*3/uL (ref 0.0–0.1)
Eosinophils Absolute: 0 10*3/uL (ref 0.0–0.5)
Eosinophils Relative: 0 %
HCT: 41.1 % (ref 36.0–46.0)
HEMOGLOBIN: 12.5 g/dL (ref 12.0–15.0)
IMMATURE GRANULOCYTES: 0 %
LYMPHS ABS: 1.3 10*3/uL (ref 0.7–4.0)
LYMPHS PCT: 18 %
MCH: 25.2 pg — ABNORMAL LOW (ref 26.0–34.0)
MCHC: 30.4 g/dL (ref 30.0–36.0)
MCV: 82.9 fL (ref 80.0–100.0)
Monocytes Absolute: 0.3 10*3/uL (ref 0.1–1.0)
Monocytes Relative: 4 %
NEUTROS ABS: 5.6 10*3/uL (ref 1.7–7.7)
NEUTROS PCT: 78 %
PLATELETS: 435 10*3/uL — AB (ref 150–400)
RBC: 4.96 MIL/uL (ref 3.87–5.11)
RDW: 17.5 % — ABNORMAL HIGH (ref 11.5–15.5)
WBC: 7.2 10*3/uL (ref 4.0–10.5)
nRBC: 0 % (ref 0.0–0.2)

## 2018-08-14 LAB — COMPREHENSIVE METABOLIC PANEL
ALBUMIN: 4.5 g/dL (ref 3.5–5.0)
ALK PHOS: 83 U/L (ref 38–126)
ALT: 33 U/L (ref 0–44)
AST: 25 U/L (ref 15–41)
Anion gap: 10 (ref 5–15)
BILIRUBIN TOTAL: 0.6 mg/dL (ref 0.3–1.2)
BUN: 11 mg/dL (ref 6–20)
CALCIUM: 9.6 mg/dL (ref 8.9–10.3)
CO2: 23 mmol/L (ref 22–32)
CREATININE: 0.62 mg/dL (ref 0.44–1.00)
Chloride: 103 mmol/L (ref 98–111)
GFR calc Af Amer: >60 mL/min (ref >60)
GFR calc non Af Amer: >60 mL/min (ref >60)
GLUCOSE: 96 mg/dL (ref 70–99)
Potassium: 3.8 mmol/L (ref 3.5–5.1)
Sodium: 136 mmol/L (ref 135–145)
TOTAL PROTEIN: 8.6 g/dL — AB (ref 6.5–8.1)

## 2018-08-14 LAB — URINALYSIS, ROUTINE W REFLEX MICROSCOPIC
Bilirubin Urine: NEGATIVE
GLUCOSE, UA: NEGATIVE mg/dL
Hgb urine dipstick: NEGATIVE
Ketones, ur: NEGATIVE mg/dL
Nitrite: NEGATIVE
PH: 6.5 (ref 5.0–8.0)
Protein, ur: NEGATIVE mg/dL
Specific Gravity, Urine: 1.01 (ref 1.005–1.030)

## 2018-08-14 LAB — URINALYSIS, MICROSCOPIC (REFLEX): RBC / HPF: NONE SEEN RBC/hpf (ref 0–5)

## 2018-08-14 NOTE — ED Notes (Signed)
Pt visualized texting on phone while sitting in wheel chair in lobby.

## 2018-08-14 NOTE — ED Notes (Signed)
Pt ambulatory to bathroom to provide urine sample
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
## 2018-08-14 NOTE — Discharge Instructions (Signed)
You may use over-the-counter Motrin (Ibuprofen), Acetaminophen (Tylenol), topical muscle creams such as SalonPas, Federal-M[place]ulcy Hot, Bengay, etc. Please stretch, apply ice for the next 3-4 days then you may use heat, and have massage therapy for additional assistance.
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
## 2018-08-14 NOTE — ED Notes (Signed)
Patient verbalizes understanding of discharge instructions. Opportunity for questioning and answers were provided. Armband removed by staff, pt discharged from ED home with family via POV.  

## 2018-09-17 ENCOUNTER — Encounter (HOSPITAL_BASED_OUTPATIENT_CLINIC_OR_DEPARTMENT_OTHER): Payer: Self-pay | Admitting: Emergency Medicine

## 2018-09-17 ENCOUNTER — Other Ambulatory Visit: Payer: Self-pay

## 2018-09-17 ENCOUNTER — Emergency Department (HOSPITAL_BASED_OUTPATIENT_CLINIC_OR_DEPARTMENT_OTHER)
Admission: EM | Admit: 2018-09-17 | Discharge: 2018-09-17 | Disposition: A | Discharge status: Home / Self Care | Payer: 59 | Attending: Emergency Medicine | Admitting: Emergency Medicine

## 2018-09-17 DIAGNOSIS — Z5321 Procedure and treatment not carried out due to patient leaving prior to being seen by health care provider: Secondary | ICD-10-CM | POA: Diagnosis not present

## 2018-09-17 DIAGNOSIS — R51 Headache: Secondary | ICD-10-CM | POA: Insufficient documentation

## 2018-09-17 NOTE — ED Triage Notes (Signed)
Pt c/o cold like symptoms for the past few days with HA, cough and generalized body ache.

## 2018-12-06 ENCOUNTER — Encounter (HOSPITAL_COMMUNITY): Payer: Self-pay

## 2018-12-06 ENCOUNTER — Ambulatory Visit (HOSPITAL_COMMUNITY)
Admission: EM | Admit: 2018-12-06 | Discharge: 2018-12-06 | Disposition: A | Discharge status: Home / Self Care | Payer: 59 | Attending: Family Medicine | Admitting: Family Medicine

## 2018-12-06 ENCOUNTER — Other Ambulatory Visit: Payer: Self-pay

## 2018-12-06 DIAGNOSIS — K529 Noninfective gastroenteritis and colitis, unspecified: Secondary | ICD-10-CM

## 2018-12-06 MED ORDER — ONDANSETRON 4 MG PO TBDP: 4.0000 mg | ORAL_TABLET | Freq: Three times a day (TID) | ORAL | 0 refills | Status: DC | PRN

## 2018-12-06 NOTE — ED Triage Notes (Signed)
Pt cc vomiting, body aches, diarrhea x 3 days

## 2018-12-06 NOTE — Discharge Instructions (Addendum)
Please do your best to ensure adequate fluid intake in order to avoid dehydration. If you find that you are unable to tolerate drinking fluids regularly please proceed to the Emergency Department for evaluation.  Also, you should return to the hospital if you experience persistent fevers for greater than 1-2 more days, increasing abdominal pain that persists despite medications, persistent diarrhea, dizziness, syncope (fainting), or for any other concerns you may find worrisome.  

## 2018-12-07 NOTE — ED Provider Notes (Signed)
[facility]   [id number] 12/06/18 Arrival Time: 1857  ASSESSMENT & PLAN:  1. Gastroenteritis    No signs of dehydration requiring IVF at this time. Benign abdominal exam. No indication for urgent abdominal imaging at this time. Discussed.  Meds ordered this encounter  Medications  . ondansetron (ZOFRAN-ODT) 4 MG disintegrating tablet    Sig: Take 1 tablet (4 mg total) by mouth every 8 (eight) hours as needed for nausea or vomiting.    Dispense:  15 tablet    Refill:  0   Discussed typical duration of symptoms for suspected viral GI illness. Will do her best to ensure adequate fluid intake in order to avoid dehydration. Will proceed to the Emergency Department for evaluation if unable to tolerate PO fluids regularly.  Otherwise she will f/u with her PCP or here if not showing improvement over the next 48-72 hours.  Reviewed expectations re: course of current medical issues. Questions answered. Outlined signs and symptoms indicating need for more acute intervention. Patient verbalized understanding. After Visit Summary given.   SUBJECTIVE: History from: patient.  [name] is a 23 y.o. female who presents with complaint of non-bilious, non-bloody intermittent nausea and vomiting of brown material with non-bloody diarrhea. Onset approx 2-3 d ago. Abdominal discomfort: mild and cramping. Symptoms are gradually improving since beginning. Aggravating factors: eating. Alleviating factors: none. Associated symptoms: fatigue. She denies fever and headache. Appetite: decreased. PO intake: decreased. Ambulatory without assistance. Urinary symptoms: none. Sick contacts: none. Recent travel or camping: none. OTC treatment: none.  Patient's last menstrual period was 11/17/2018.  Past Surgical History:  Procedure Laterality Date  . HERNIA REPAIR    . UMBILICAL HERNIA REPAIR     ROS: As per HPI. All other systems negative.   OBJECTIVE:  Vitals:   12/06/18 1922  12/06/18 1923  BP: 115/74   Pulse: 88   Resp: 18   Temp: 97.8 F (36.6 C)   TempSrc: Tympanic   SpO2: 100%   Weight:  97.5 kg    General appearance: alert; no distress Oropharynx: moist Lungs: clear to auscultation bilaterally; unlabored Heart: regular rate and rhythm Abdomen: soft; non-distended; no significant abdominal tenderness; reports "cramping" feeling; bowel sounds present; no masses or organomegaly; no guarding or rebound tenderness Back: no CVA tenderness Extremities: no edema; symmetrical with no gross deformities Skin: warm; dry Neurologic: normal gait Psychological: alert and cooperative; normal mood and affect   No Known Allergies                                             Past Medical History:  Diagnosis Date  . Medical history non-contributory    Social History   Socioeconomic History  . Marital status: Single    Spouse name: Not on file  . Number of children: Not on file  . Years of education: Not on file  . Highest education level: Not on file  Occupational History  . Not on file  Social Needs  . Financial resource strain: Not hard at all  . Food insecurity:    Worry: Never true    Inability: Never true  . Transportation needs:    Medical: No    Non-medical: No  Tobacco Use  . Smoking status: Never Smoker  . Smokeless tobacco: Never Used  Substance and Sexual Activity  . Alcohol use: No  . Drug use:  No  . Sexual activity: Yes    Birth control/protection: None  Lifestyle  . Physical activity:    Days per week: 0 days    Minutes per session: 0 min  . Stress: Not at all  Relationships  . Social connections:    Talks on phone: More than three times a week    Gets together: More than three times a week    Attends religious service: Never    Active member of club or organization: No    Attends meetings of clubs or organizations: Never    Relationship status: Never married  . Intimate partner violence:    Fear of current or ex partner:  No    Emotionally abused: No    Physically abused: No    Forced sexual activity: No  Other Topics Concern  . Not on file  Social History Narrative  . Not on file   Family History  Problem Relation Age of Onset  . Cancer Father        stomach  . Cancer Other        lymphoma  . Diabetes Other      Mardella Layman, MD 12/07/18 [phone]

## 2019-03-08 ENCOUNTER — Other Ambulatory Visit: Payer: Self-pay

## 2019-03-08 ENCOUNTER — Encounter (HOSPITAL_COMMUNITY): Payer: Self-pay

## 2019-03-08 ENCOUNTER — Emergency Department (HOSPITAL_COMMUNITY)
Admission: EM | Admit: 2019-03-08 | Discharge: 2019-03-08 | Disposition: A | Discharge status: Home / Self Care | Payer: 59 | Attending: Emergency Medicine | Admitting: Emergency Medicine

## 2019-03-08 DIAGNOSIS — Z79899 Other long term (current) drug therapy: Secondary | ICD-10-CM | POA: Diagnosis not present

## 2019-03-08 DIAGNOSIS — G43009 Migraine without aura, not intractable, without status migrainosus: Secondary | ICD-10-CM

## 2019-03-08 DIAGNOSIS — R51 Headache: Secondary | ICD-10-CM | POA: Diagnosis present

## 2019-03-08 HISTORY — DX: Migraine, unspecified, not intractable, without status migrainosus: G43.909

## 2019-03-08 MED ORDER — ONDANSETRON 8 MG PO TBDP: 8.0000 mg | ORAL_TABLET | Freq: Three times a day (TID) | ORAL | 0 refills | Status: DC | PRN

## 2019-03-08 MED ORDER — BUTALBITAL-APAP-CAFFEINE 50-325-40 MG PO TABS: 1.0000 | ORAL_TABLET | Freq: Four times a day (QID) | ORAL | 0 refills | Status: AC | PRN

## 2019-03-08 NOTE — ED Provider Notes (Signed)
[facility]   CSN: [id number] Arrival date & time: 03/08/19  1721     History   Chief Complaint Chief Complaint  Patient presents with  . Headache  . Emesis    HPI [name] is a 23 y.o. female.     23 year old female with history of migraines presents with her usual migraine equivalent.  Has had frontal headache with nonbilious emesis.  Denies any fever or chills.  No neck pain.  No photo or phonophobia.  Slight blurred vision which is improved.  Has been medicating with Tylenol without relief.  States that the trigger is likely recent weight gain.     Past Medical History:  Diagnosis Date  . Medical history non-contributory   . Migraine     Patient Active Problem List   Diagnosis Date Noted  . Postpartum care and examination 06/08/2018  . History of umbilical hernia repair 02/06/2018    Past Surgical History:  Procedure Laterality Date  . HERNIA REPAIR    . UMBILICAL HERNIA REPAIR       OB History    Gravida  1   Para  1   Term      Preterm  1   AB      Living  1     SAB      TAB      Ectopic      Multiple  0   Live Births  1            Home Medications    Prior to Admission medications   Medication Sig Start Date End Date Taking? Authorizing Provider  Misc. Devices (BREAST PUMP) MISC 1 Device by Other route as needed. 05/23/18   Brock BadHarper, Charles A, MD  norethindrone (MICRONOR,CAMILA,ERRIN) 0.35 MG tablet Take 1 tablet (0.35 mg total) by mouth daily. 06/08/18   Hermina StaggersErvin, Michael L, MD  ondansetron (ZOFRAN-ODT) 4 MG disintegrating tablet Take 1 tablet (4 mg total) by mouth every 8 (eight) hours as needed for nausea or vomiting. 12/06/18   Mardella LaymanHagler, Brian, MD    Family History Family History  Problem Relation Age of Onset  . Cancer Father        stomach  . Cancer Other        lymphoma  . Diabetes Other     Social History Social History   Tobacco Use  . Smoking status: Never  Smoker  . Smokeless tobacco: Never Used  Substance Use Topics  . Alcohol use: No  . Drug use: No     Allergies   Other   Review of Systems Review of Systems  All other systems reviewed and are negative.    Physical Exam Updated Vital Signs BP 140/80 (BP Location: Left Arm)   Pulse 80   Temp 98.8 F (37.1 C) (Oral)   Resp 15   Ht 1.74 m (5' 8.5")   Wt 113.4 kg   LMP 02/17/2019 (Approximate)   SpO2 99%   BMI 37.46 kg/m   Physical Exam Vitals signs and nursing note reviewed.  Constitutional:      General: She is not in acute distress.    Appearance: Normal appearance. She is well-developed. She is not toxic-appearing.  HENT:     Head: Normocephalic and atraumatic.  Eyes:     General: Lids are normal.     Conjunctiva/sclera: Conjunctivae normal.     Pupils: Pupils are equal, round, and reactive to light.  Neck:  Musculoskeletal: Normal range of motion and neck supple.     Thyroid: No thyroid mass.     Trachea: No tracheal deviation.  Cardiovascular:     Rate and Rhythm: Normal rate and regular rhythm.     Heart sounds: Normal heart sounds. No murmur. No gallop.   Pulmonary:     Effort: Pulmonary effort is normal. No respiratory distress.     Breath sounds: Normal breath sounds. No stridor. No decreased breath sounds, wheezing, rhonchi or rales.  Abdominal:     General: Bowel sounds are normal. There is no distension.     Palpations: Abdomen is soft.     Tenderness: There is no abdominal tenderness. There is no rebound.  Musculoskeletal: Normal range of motion.        General: No tenderness.  Skin:    General: Skin is warm and dry.     Findings: No abrasion or rash.  Neurological:     Mental Status: She is alert and oriented to person, place, and time.     GCS: GCS eye subscore is 4. GCS verbal subscore is 5. GCS motor subscore is 6.     Cranial Nerves: No cranial nerve deficit.     Sensory: No sensory deficit.     Motor: No weakness or tremor.      Gait: Gait normal.     Comments: Strength is 5 out of 5 in upper as well as lower extremities  Psychiatric:        Speech: Speech normal.        Behavior: Behavior normal.      ED Treatments / Results  Labs (all labs ordered are listed, but only abnormal results are displayed) Labs Reviewed - No data to display  EKG    Radiology No results found.  Procedures Procedures (including critical care time)  Medications Ordered in ED Medications - No data to display   Initial Impression / Assessment and Plan / ED Course  I have reviewed the triage vital signs and the nursing notes.  Pertinent labs & imaging results that were available during my care of the patient were reviewed by me and considered in my medical decision making (see chart for details).        Patient's neurological exam is stable here.  Will prescribe Fioricet for headaches as well as Zofran and she will follow-up with her doctor.  Final Clinical Impressions(s) / ED Diagnoses   Final diagnoses:  None    ED Discharge Orders    None       Lacretia Leigh, MD 03/08/19 1807

## 2019-03-08 NOTE — ED Triage Notes (Signed)
Patient c/o headache since last night and vomiting this AM. Patient states she had blurred vision last night and much less today. Patient denies sensitivity to light. Patient reports a history of migraines.

## 2021-05-07 ENCOUNTER — Encounter (HOSPITAL_BASED_OUTPATIENT_CLINIC_OR_DEPARTMENT_OTHER): Payer: Self-pay | Admitting: *Deleted

## 2021-05-07 ENCOUNTER — Emergency Department (HOSPITAL_BASED_OUTPATIENT_CLINIC_OR_DEPARTMENT_OTHER)
Admission: EM | Admit: 2021-05-07 | Discharge: 2021-05-07 | Disposition: A | Discharge status: Home / Self Care | Payer: 59 | Attending: Emergency Medicine | Admitting: Emergency Medicine

## 2021-05-07 ENCOUNTER — Other Ambulatory Visit: Payer: Self-pay

## 2021-05-07 DIAGNOSIS — Z20822 Contact with and (suspected) exposure to covid-19: Secondary | ICD-10-CM | POA: Diagnosis not present

## 2021-05-07 DIAGNOSIS — R519 Headache, unspecified: Secondary | ICD-10-CM | POA: Diagnosis not present

## 2021-05-07 DIAGNOSIS — R112 Nausea with vomiting, unspecified: Secondary | ICD-10-CM | POA: Diagnosis not present

## 2021-05-07 LAB — RESP PANEL BY RT-PCR (FLU A&B, COVID) ARPGX2
Influenza A by PCR: NEGATIVE
Influenza B by PCR: NEGATIVE
SARS Coronavirus 2 by RT PCR: NEGATIVE

## 2021-05-07 MED ORDER — PROCHLORPERAZINE MALEATE 10 MG PO TABS: 10.0000 mg | ORAL_TABLET | Freq: Two times a day (BID) | ORAL | 0 refills | Status: AC | PRN

## 2021-05-07 MED ORDER — LACTATED RINGERS IV BOLUS
1000.0000 mL | Freq: Once | INTRAVENOUS | Status: AC
  Administered 2021-05-07: 1000 mL via INTRAVENOUS

## 2021-05-07 MED ORDER — DIPHENHYDRAMINE HCL 50 MG/ML IJ SOLN
25.0000 mg | Freq: Once | INTRAMUSCULAR | Status: AC
  Administered 2021-05-07: 25 mg via INTRAVENOUS
  Filled 2021-05-07: qty 1

## 2021-05-07 MED ORDER — PROCHLORPERAZINE EDISYLATE 10 MG/2ML IJ SOLN
10.0000 mg | Freq: Once | INTRAMUSCULAR | Status: AC
  Administered 2021-05-07: 10 mg via INTRAVENOUS
  Filled 2021-05-07: qty 2

## 2021-05-07 NOTE — ED Notes (Signed)
States this HA is different from previous Migraines, and is more worse

## 2021-05-07 NOTE — ED Triage Notes (Signed)
Presents with HA and having nausea, states moving around, up walking makes HA worse, is light sensitive. Has hx of migraine headaches.

## 2021-05-07 NOTE — ED Notes (Signed)
ED Provider at bedside. 

## 2021-05-08 NOTE — ED Provider Notes (Signed)
[facility] EMERGENCY DEPARTMENT Provider Note   CSN: [id number] Arrival date & time: 05/07/21  0957     History Chief Complaint  Patient presents with   Migraine    [name] is a 25 y.o. female.  HPI     25 year old female with a prior history of migraine presents with concern for headache.  Reports that she has had a headache present for 4 days which began slowly and has been waxing and waning, and is sharp at times.  She did have some nausea and vomiting develop.  Reports some photophobia but no phonophobia.  She had a history of migraines after her last child, but has not had migraines for several years.  Denies any head trauma.  Reports the headache is worse when she stands up and walks around, but does not have a history of exertional headaches and describes it being more positional than exertional.  Reports she feels better if she lays down and closes her eyes.  Denies numbness, weakness, difficulty talking or walking, visual changes or facial droop.  Did have some diarrhea recently.  No cough, fever or other symptoms.  Past Medical History:  Diagnosis Date   Medical history non-contributory    Migraine     Patient Active Problem List   Diagnosis Date Noted   Postpartum care and examination 06/08/2018   History of umbilical hernia repair 02/06/2018    Past Surgical History:  Procedure Laterality Date   HERNIA REPAIR     UMBILICAL HERNIA REPAIR       OB History     Gravida  1   Para  1   Term      Preterm  1   AB      Living  1      SAB      IAB      Ectopic      Multiple  0   Live Births  1           Family History  Problem Relation Age of Onset   Cancer Father        stomach   Cancer Other        lymphoma   Diabetes Other     Social History   Tobacco Use   Smoking status: Never   Smokeless tobacco: Never  Vaping Use   Vaping Use: Never used  Substance Use Topics   Alcohol use: No   Drug use: No    Home  Medications Prior to Admission medications   Medication Sig Start Date End Date Taking? Authorizing Provider  prochlorperazine (COMPAZINE) 10 MG tablet Take 1 tablet (10 mg total) by mouth 2 (two) times daily as needed for nausea or vomiting (or headache). 05/07/21  Yes Alvira Monday, MD  Misc. Devices (BREAST PUMP) MISC 1 Device by Other route as needed. 05/23/18   Brock Bad, MD  norethindrone (MICRONOR,CAMILA,ERRIN) 0.35 MG tablet Take 1 tablet (0.35 mg total) by mouth daily. 06/08/18   Hermina Staggers, MD  ondansetron (ZOFRAN ODT) 8 MG disintegrating tablet Take 1 tablet (8 mg total) by mouth every 8 (eight) hours as needed for nausea or vomiting. 03/08/19   Lorre Nick, MD  ondansetron (ZOFRAN-ODT) 4 MG disintegrating tablet Take 1 tablet (4 mg total) by mouth every 8 (eight) hours as needed for nausea or vomiting. 12/06/18   Mardella Layman, MD    Allergies    Other  Review of Systems   Review of Systems  Constitutional:  Negative for fever.  HENT:  Negative for sore throat.   Eyes:  Negative for visual disturbance.  Respiratory:  Negative for cough and shortness of breath.   Cardiovascular:  Negative for chest pain.  Gastrointestinal:  Positive for diarrhea, nausea and vomiting. Negative for abdominal pain.  Genitourinary:  Negative for difficulty urinating.  Musculoskeletal:  Negative for back pain and neck pain.  Skin:  Negative for rash.  Neurological:  Positive for headaches. Negative for dizziness, syncope, facial asymmetry, speech difficulty, weakness and numbness.   Physical Exam Updated Vital Signs BP 93/63 (BP Location: Right Arm)   Pulse 64   Temp 98.5 F (36.9 C) (Oral)   Resp 18   Ht 5' 8.5" (1.74 m)   Wt 117.9 kg   SpO2 100%   BMI 38.96 kg/m   Physical Exam Vitals and nursing note reviewed.  Constitutional:      General: She is not in acute distress.    Appearance: She is well-developed. She is not diaphoretic.  HENT:     Head: Normocephalic and  atraumatic.  Eyes:     Conjunctiva/sclera: Conjunctivae normal.  Cardiovascular:     Rate and Rhythm: Normal rate and regular rhythm.     Heart sounds: Normal heart sounds. No murmur heard.   No friction rub. No gallop.  Pulmonary:     Effort: Pulmonary effort is normal. No respiratory distress.     Breath sounds: Normal breath sounds. No wheezing or rales.  Abdominal:     General: There is no distension.     Palpations: Abdomen is soft.     Tenderness: There is no abdominal tenderness. There is no guarding.  Musculoskeletal:        General: No tenderness.     Cervical back: Normal range of motion.  Skin:    General: Skin is warm and dry.     Findings: No erythema or rash.  Neurological:     Mental Status: She is alert and oriented to person, place, and time.    ED Results / Procedures / Treatments   Labs (all labs ordered are listed, but only abnormal results are displayed) Labs Reviewed  RESP PANEL BY RT-PCR (FLU A&B, COVID) ARPGX2    EKG None  Radiology No results found.  Procedures Procedures   Medications Ordered in ED Medications  lactated ringers bolus 1,000 mL ( Intravenous Stopped 05/07/21 1158)  prochlorperazine (COMPAZINE) injection 10 mg (10 mg Intravenous Given 05/07/21 1045)  diphenhydrAMINE (BENADRYL) injection 25 mg (25 mg Intravenous Given 05/07/21 1044)    ED Course  I have reviewed the triage vital signs and the nursing notes.  Pertinent labs & imaging results that were available during my care of the patient were reviewed by me and considered in my medical decision making (see chart for details).    MDM Rules/Calculators/A&P                            25 year old female with a prior history of migraine presents with concern for headache.  Headache began slowly, no trauma, no fevers, and normal neurologic exam and have low suspicion for [facility], SDH or meningitis.  Has history of migraines and headache with migrainous components.  Does have  orthostatic component-do not have reason to suspect CSF leak.  Patient was given compazine and benadryl with improvement in headache.  Patient discharged in stable condition with understanding of reasons to return and recommend Neurology follow  up for question orthostatic headaches.   Final Clinical Impression(s) / ED Diagnoses Final diagnoses:  Bad headache    Rx / DC Orders ED Discharge Orders          Ordered    prochlorperazine (COMPAZINE) 10 MG tablet  2 times daily PRN        05/07/21 1249             Alvira Monday, MD 05/08/21 [phone]

## 2021-06-20 ENCOUNTER — Inpatient Hospital Stay (HOSPITAL_BASED_OUTPATIENT_CLINIC_OR_DEPARTMENT_OTHER)
Admission: EM | Admit: 2021-06-20 | Discharge: 2021-06-24 | DRG: 872 | Disposition: A | Discharge status: Home / Self Care | Payer: 59 | Attending: Internal Medicine | Admitting: Internal Medicine

## 2021-06-20 ENCOUNTER — Emergency Department (HOSPITAL_BASED_OUTPATIENT_CLINIC_OR_DEPARTMENT_OTHER): Payer: 59

## 2021-06-20 ENCOUNTER — Encounter (HOSPITAL_BASED_OUTPATIENT_CLINIC_OR_DEPARTMENT_OTHER): Payer: Self-pay | Admitting: Emergency Medicine

## 2021-06-20 ENCOUNTER — Other Ambulatory Visit: Payer: Self-pay

## 2021-06-20 DIAGNOSIS — N12 Tubulo-interstitial nephritis, not specified as acute or chronic: Secondary | ICD-10-CM | POA: Diagnosis present

## 2021-06-20 DIAGNOSIS — Z20822 Contact with and (suspected) exposure to covid-19: Secondary | ICD-10-CM | POA: Diagnosis present

## 2021-06-20 DIAGNOSIS — Z87440 Personal history of urinary (tract) infections: Secondary | ICD-10-CM | POA: Diagnosis not present

## 2021-06-20 DIAGNOSIS — N10 Acute tubulo-interstitial nephritis: Secondary | ICD-10-CM | POA: Diagnosis present

## 2021-06-20 DIAGNOSIS — N179 Acute kidney failure, unspecified: Secondary | ICD-10-CM | POA: Diagnosis present

## 2021-06-20 DIAGNOSIS — Z6839 Body mass index (BMI) 39.0-39.9, adult: Secondary | ICD-10-CM

## 2021-06-20 DIAGNOSIS — A4151 Sepsis due to Escherichia coli [E. coli]: Principal | ICD-10-CM | POA: Diagnosis present

## 2021-06-20 DIAGNOSIS — Z28310 Unvaccinated for covid-19: Secondary | ICD-10-CM | POA: Diagnosis not present

## 2021-06-20 DIAGNOSIS — D649 Anemia, unspecified: Secondary | ICD-10-CM | POA: Diagnosis present

## 2021-06-20 DIAGNOSIS — E876 Hypokalemia: Secondary | ICD-10-CM | POA: Diagnosis present

## 2021-06-20 DIAGNOSIS — A419 Sepsis, unspecified organism: Secondary | ICD-10-CM | POA: Diagnosis present

## 2021-06-20 DIAGNOSIS — Z79899 Other long term (current) drug therapy: Secondary | ICD-10-CM

## 2021-06-20 DIAGNOSIS — E669 Obesity, unspecified: Secondary | ICD-10-CM | POA: Diagnosis present

## 2021-06-20 LAB — URINALYSIS, MICROSCOPIC (REFLEX): WBC, UA: >50 WBC/hpf (ref 0–5)

## 2021-06-20 LAB — COMPREHENSIVE METABOLIC PANEL
ALT: 38 U/L (ref 0–44)
AST: 26 U/L (ref 15–41)
Albumin: 3.8 g/dL (ref 3.5–5.0)
Alkaline Phosphatase: 90 U/L (ref 38–126)
Anion gap: 9 (ref 5–15)
BUN: 12 mg/dL (ref 6–20)
CO2: 25 mmol/L (ref 22–32)
Calcium: 9.1 mg/dL (ref 8.9–10.3)
Chloride: 100 mmol/L (ref 98–111)
Creatinine, Ser: 1.3 mg/dL — ABNORMAL HIGH (ref 0.44–1.00)
GFR, Estimated: 59 mL/min — ABNORMAL LOW (ref >60)
Glucose, Bld: 108 mg/dL — ABNORMAL HIGH (ref 70–99)
Potassium: 3.3 mmol/L — ABNORMAL LOW (ref 3.5–5.1)
Sodium: 134 mmol/L — ABNORMAL LOW (ref 135–145)
Total Bilirubin: 1.4 mg/dL — ABNORMAL HIGH (ref 0.3–1.2)
Total Protein: 8.5 g/dL — ABNORMAL HIGH (ref 6.5–8.1)

## 2021-06-20 LAB — CBC WITH DIFFERENTIAL/PLATELET
Abs Immature Granulocytes: 0.09 10*3/uL — ABNORMAL HIGH (ref 0.00–0.07)
Basophils Absolute: 0 10*3/uL (ref 0.0–0.1)
Basophils Relative: 0 %
Eosinophils Absolute: 0 10*3/uL (ref 0.0–0.5)
Eosinophils Relative: 0 %
HCT: 37.7 % (ref 36.0–46.0)
Hemoglobin: 12.2 g/dL (ref 12.0–15.0)
Immature Granulocytes: 1 %
Lymphocytes Relative: 8 %
Lymphs Abs: 1.3 10*3/uL (ref 0.7–4.0)
MCH: 27.5 pg (ref 26.0–34.0)
MCHC: 32.4 g/dL (ref 30.0–36.0)
MCV: 84.9 fL (ref 80.0–100.0)
Monocytes Absolute: 1.1 10*3/uL — ABNORMAL HIGH (ref 0.1–1.0)
Monocytes Relative: 7 %
Neutro Abs: 12.7 10*3/uL — ABNORMAL HIGH (ref 1.7–7.7)
Neutrophils Relative %: 84 %
Platelets: 320 10*3/uL (ref 150–400)
RBC: 4.44 MIL/uL (ref 3.87–5.11)
RDW: 14.6 % (ref 11.5–15.5)
WBC: 15.2 10*3/uL — ABNORMAL HIGH (ref 4.0–10.5)
nRBC: 0 % (ref 0.0–0.2)

## 2021-06-20 LAB — MAGNESIUM: Magnesium: 2 mg/dL (ref 1.7–2.4)

## 2021-06-20 LAB — BASIC METABOLIC PANEL
Anion gap: 9 (ref 5–15)
BUN: 11 mg/dL (ref 6–20)
CO2: 26 mmol/L (ref 22–32)
Calcium: 8.9 mg/dL (ref 8.9–10.3)
Chloride: 103 mmol/L (ref 98–111)
Creatinine, Ser: 1.07 mg/dL — ABNORMAL HIGH (ref 0.44–1.00)
GFR, Estimated: >60 mL/min (ref >60)
Glucose, Bld: 109 mg/dL — ABNORMAL HIGH (ref 70–99)
Potassium: 3.4 mmol/L — ABNORMAL LOW (ref 3.5–5.1)
Sodium: 138 mmol/L (ref 135–145)

## 2021-06-20 LAB — LIPASE, BLOOD: Lipase: 24 U/L (ref 11–51)

## 2021-06-20 LAB — URINALYSIS, ROUTINE W REFLEX MICROSCOPIC

## 2021-06-20 LAB — CK: Total CK: 31 U/L — ABNORMAL LOW (ref 38–234)

## 2021-06-20 LAB — RESP PANEL BY RT-PCR (FLU A&B, COVID) ARPGX2
Influenza A by PCR: NEGATIVE
Influenza B by PCR: NEGATIVE
SARS Coronavirus 2 by RT PCR: NEGATIVE

## 2021-06-20 LAB — PHOSPHORUS: Phosphorus: 2.4 mg/dL — ABNORMAL LOW (ref 2.5–4.6)

## 2021-06-20 LAB — PROCALCITONIN: Procalcitonin: 1.85 ng/mL

## 2021-06-20 LAB — LACTIC ACID, PLASMA
Lactic Acid, Venous: 1.6 mmol/L (ref 0.5–1.9)
Lactic Acid, Venous: 1.2 mmol/L (ref 0.5–1.9)

## 2021-06-20 LAB — PREGNANCY, URINE: Preg Test, Ur: NEGATIVE

## 2021-06-20 MED ORDER — ACETAMINOPHEN 325 MG PO TABS
650.0000 mg | ORAL_TABLET | Freq: Once | ORAL | Status: AC
  Administered 2021-06-20: 650 mg via ORAL
  Filled 2021-06-20: qty 2

## 2021-06-20 MED ORDER — SODIUM CHLORIDE 0.9 % IV SOLN: INTRAVENOUS | Status: DC | PRN

## 2021-06-20 MED ORDER — SODIUM CHLORIDE 0.9 % IV BOLUS
1000.0000 mL | Freq: Once | INTRAVENOUS | Status: AC
  Administered 2021-06-20: 1000 mL via INTRAVENOUS

## 2021-06-20 MED ORDER — POTASSIUM CHLORIDE CRYS ER 20 MEQ PO TBCR
40.0000 meq | EXTENDED_RELEASE_TABLET | Freq: Once | ORAL | Status: AC
  Administered 2021-06-20: 40 meq via ORAL
  Filled 2021-06-20: qty 2

## 2021-06-20 MED ORDER — HYDROCODONE-ACETAMINOPHEN 5-325 MG PO TABS
1.0000 | ORAL_TABLET | ORAL | Status: DC | PRN
  Administered 2021-06-20 – 2021-06-24 (×7): 1 via ORAL
  Filled 2021-06-20 (×7): qty 1

## 2021-06-20 MED ORDER — ACETAMINOPHEN 325 MG PO TABS
650.0000 mg | ORAL_TABLET | Freq: Four times a day (QID) | ORAL | Status: DC | PRN
  Administered 2021-06-20 – 2021-06-23 (×7): 650 mg via ORAL
  Filled 2021-06-20 (×7): qty 2

## 2021-06-20 MED ORDER — ONDANSETRON HCL 4 MG/2ML IJ SOLN
4.0000 mg | Freq: Once | INTRAMUSCULAR | Status: AC
  Administered 2021-06-20: 4 mg via INTRAVENOUS
  Filled 2021-06-20: qty 2

## 2021-06-20 MED ORDER — SODIUM CHLORIDE 0.9 % IV SOLN
1.0000 g | INTRAVENOUS | Status: DC
  Administered 2021-06-21: 1 g via INTRAVENOUS
  Filled 2021-06-20 (×2): qty 10

## 2021-06-20 MED ORDER — SODIUM CHLORIDE 0.9 % IV SOLN
1.0000 g | Freq: Once | INTRAVENOUS | Status: AC
  Administered 2021-06-20: 1 g via INTRAVENOUS
  Filled 2021-06-20: qty 10

## 2021-06-20 MED ORDER — MORPHINE SULFATE (PF) 2 MG/ML IV SOLN
2.0000 mg | INTRAVENOUS | Status: DC | PRN
  Administered 2021-06-21 – 2021-06-22 (×2): 2 mg via INTRAVENOUS
  Filled 2021-06-20 (×2): qty 1

## 2021-06-20 MED ORDER — K PHOS MONO-SOD PHOS DI & MONO 155-852-130 MG PO TABS
250.0000 mg | ORAL_TABLET | Freq: Once | ORAL | Status: AC
  Administered 2021-06-20: 250 mg via ORAL
  Filled 2021-06-20: qty 1

## 2021-06-20 MED ORDER — ENOXAPARIN SODIUM 60 MG/0.6ML IJ SOSY
60.0000 mg | PREFILLED_SYRINGE | INTRAMUSCULAR | Status: DC
  Administered 2021-06-20 – 2021-06-23 (×4): 60 mg via SUBCUTANEOUS
  Filled 2021-06-20 (×4): qty 0.6

## 2021-06-20 MED ORDER — ONDANSETRON HCL 4 MG/2ML IJ SOLN
4.0000 mg | Freq: Four times a day (QID) | INTRAMUSCULAR | Status: DC | PRN
  Administered 2021-06-21 – 2021-06-22 (×6): 4 mg via INTRAVENOUS
  Filled 2021-06-20 (×6): qty 2

## 2021-06-20 MED ORDER — ACETAMINOPHEN 650 MG RE SUPP: 650.0000 mg | Freq: Four times a day (QID) | RECTAL | Status: DC | PRN

## 2021-06-20 MED ORDER — MORPHINE SULFATE (PF) 4 MG/ML IV SOLN
4.0000 mg | Freq: Once | INTRAVENOUS | Status: AC
  Administered 2021-06-20: 4 mg via INTRAVENOUS
  Filled 2021-06-20: qty 1

## 2021-06-20 NOTE — Progress Notes (Signed)
Patient arrived on unit via [facility]; patient is alert and oriented and no distress. Patient states pain is better; denies need for pain medication at this time; no signs of symptoms of distress; accompanied by family member; pt oriented to unit and room; provided call light; MD paged and notified for patient's arrival and need for orders. Dr Adela Glimpse states will be on unit to see patient soon.

## 2021-06-20 NOTE — Progress Notes (Signed)
Patient triggered a yellow mews for a temperature of 102.9, provider (Dr. Adela Glimpse) was made aware, patient was given tylenol, room temperature was turned down, and a cool washcloth was given. Patient encouraged to drink water and use incentive spirometer. Blood cultures were already drawn earlier today and do not need reordered at this time. Patient is resting comfortably in bed after receiving  Norco for pain earlier. Will continue to monitor and will complete vitals per yellow mews protocol.

## 2021-06-20 NOTE — ED Provider Notes (Signed)
[facility] EMERGENCY DEPARTMENT Provider Note   CSN: [id number] Arrival date & time: 06/20/21  1239     History Chief Complaint  Patient presents with   Abdominal Pain    [name] is a 25 y.o. female with a past medical history of a hernia repair presenting to the emergency department with right-sided abdominal pain, nausea and vomiting since Wednesday.  Patient reports that on Tuesday she began to have symptoms of a urinary tract infection.  At this time she began to take Tylenol and over-the-counter UTI remedy Azo to try and alleviate her symptoms.  She ran out of Tylenol and began to use Goody powder instead.  When she ran out of the [employer] powder yesterday and decided that she should come to the emergency department now because she is continued with the nausea and vomiting.  Is not tolerating p.o. intake. Denies urinary frequency, back pain or dysuria.  Endorsing urinary urgency.  Has not had any objective fevers but reports aches and chills.  Has had UTIs in the past however she has not had one in multiple years.  Sexually active with 1 partner, no concern for STI.  Last menstrual period was last week.  Reports her urine has been orange since she began taking the Azo.  Past Medical History:  Diagnosis Date   Medical history non-contributory    Migraine     Patient Active Problem List   Diagnosis Date Noted   Postpartum care and examination 06/08/2018   History of umbilical hernia repair 02/06/2018    Past Surgical History:  Procedure Laterality Date   HERNIA REPAIR     UMBILICAL HERNIA REPAIR       OB History     Gravida  1   Para  1   Term      Preterm  1   AB      Living  1      SAB      IAB      Ectopic      Multiple  0   Live Births  1           Family History  Problem Relation Age of Onset   Cancer Father        stomach   Cancer Other        lymphoma   Diabetes Other     Social History   Tobacco Use   Smoking  status: Never   Smokeless tobacco: Never  Vaping Use   Vaping Use: Never used  Substance Use Topics   Alcohol use: No   Drug use: No    Home Medications Prior to Admission medications   Medication Sig Start Date End Date Taking? Authorizing Provider  Misc. Devices (BREAST PUMP) MISC 1 Device by Other route as needed. 05/23/18   Brock Bad, MD  norethindrone (MICRONOR,CAMILA,ERRIN) 0.35 MG tablet Take 1 tablet (0.35 mg total) by mouth daily. 06/08/18   Hermina Staggers, MD  ondansetron (ZOFRAN ODT) 8 MG disintegrating tablet Take 1 tablet (8 mg total) by mouth every 8 (eight) hours as needed for nausea or vomiting. 03/08/19   Lorre Nick, MD  ondansetron (ZOFRAN-ODT) 4 MG disintegrating tablet Take 1 tablet (4 mg total) by mouth every 8 (eight) hours as needed for nausea or vomiting. 12/06/18   Mardella Layman, MD  prochlorperazine (COMPAZINE) 10 MG tablet Take 1 tablet (10 mg total) by mouth 2 (two) times daily as needed for nausea or vomiting (or  headache). 05/07/21   Alvira Monday, MD    Allergies    Other  Review of Systems   Review of Systems  Constitutional:  Positive for chills and fatigue. Negative for fever.  Gastrointestinal:  Positive for abdominal pain, nausea and vomiting. Negative for constipation and diarrhea.  Genitourinary:  Positive for urgency. Negative for decreased urine volume, dysuria, hematuria, vaginal bleeding and vaginal discharge.  Musculoskeletal:  Negative for back pain.  Skin:  Negative for rash and wound.  Neurological:  Negative for dizziness and light-headedness.  All other systems reviewed and are negative.  Physical Exam Updated Vital Signs BP 109/66   Pulse 96   Temp 100 F (37.8 C) (Oral)   Resp 15   Ht 5\' 8"  (1.727 m)   Wt 117.9 kg   LMP 06/08/2021   SpO2 100%   BMI 39.53 kg/m   Physical Exam Vitals and nursing note reviewed.  Constitutional:      General: She is not in acute distress.    Appearance: Normal appearance. She  is not ill-appearing.  HENT:     Head: Normocephalic and atraumatic.  Eyes:     General: No scleral icterus.    Conjunctiva/sclera: Conjunctivae normal.  Cardiovascular:     Rate and Rhythm: Normal rate and regular rhythm.  Pulmonary:     Effort: Pulmonary effort is normal. No respiratory distress.     Breath sounds: Normal breath sounds.  Abdominal:     General: Abdomen is flat.     Palpations: Abdomen is soft.     Tenderness: There is abdominal tenderness in the right lower quadrant. There is no right CVA tenderness, left CVA tenderness or guarding.     Hernia: No hernia is present.  Skin:    General: Skin is warm and dry.     Findings: No rash.  Neurological:     Mental Status: She is alert.  Psychiatric:        Mood and Affect: Mood normal.        Behavior: Behavior normal.    ED Results / Procedures / Treatments   Labs (all labs ordered are listed, but only abnormal results are displayed) Labs Reviewed  COMPREHENSIVE METABOLIC PANEL - Abnormal; Notable for the following components:      Result Value   Sodium 134 (*)    Potassium 3.3 (*)    Glucose, Bld 108 (*)    Creatinine, Ser 1.30 (*)    Total Protein 8.5 (*)    Total Bilirubin 1.4 (*)    GFR, Estimated 59 (*)    All other components within normal limits  CBC WITH DIFFERENTIAL/PLATELET - Abnormal; Notable for the following components:   WBC 15.2 (*)    Neutro Abs 12.7 (*)    Monocytes Absolute 1.1 (*)    Abs Immature Granulocytes 0.09 (*)    All other components within normal limits  URINALYSIS, ROUTINE W REFLEX MICROSCOPIC - Abnormal; Notable for the following components:   Color, Urine ORANGE (*)    APPearance TURBID (*)    Glucose, UA   (*)    Value: TEST NOT REPORTED DUE TO COLOR INTERFERENCE OF URINE PIGMENT   Hgb urine dipstick   (*)    Value: TEST NOT REPORTED DUE TO COLOR INTERFERENCE OF URINE PIGMENT   Bilirubin Urine   (*)    Value: TEST NOT REPORTED DUE TO COLOR INTERFERENCE OF URINE PIGMENT    Ketones, ur   (*)    Value: TEST NOT REPORTED  DUE TO COLOR INTERFERENCE OF URINE PIGMENT   Protein, ur   (*)    Value: TEST NOT REPORTED DUE TO COLOR INTERFERENCE OF URINE PIGMENT   Nitrite   (*)    Value: TEST NOT REPORTED DUE TO COLOR INTERFERENCE OF URINE PIGMENT   Leukocytes,Ua   (*)    Value: TEST NOT REPORTED DUE TO COLOR INTERFERENCE OF URINE PIGMENT   All other components within normal limits  URINALYSIS, MICROSCOPIC (REFLEX) - Abnormal; Notable for the following components:   Bacteria, UA MANY (*)    All other components within normal limits  RESP PANEL BY RT-PCR (FLU A&B, COVID) ARPGX2  URINE CULTURE  CULTURE, BLOOD (ROUTINE X 2)  CULTURE, BLOOD (ROUTINE X 2)  LIPASE, BLOOD  PREGNANCY, URINE    EKG None  Radiology CT ABDOMEN PELVIS WO CONTRAST  Result Date: 06/20/2021 CLINICAL DATA:  Acute abdominal pain for 5 days. EXAM: CT ABDOMEN AND PELVIS WITHOUT CONTRAST TECHNIQUE: Multidetector CT imaging of the abdomen and pelvis was performed following the standard protocol without IV contrast. COMPARISON:  CT abdomen pelvis dated 02/04/2015. FINDINGS: Lower chest: No acute abnormality. Hepatobiliary: No focal liver abnormality is seen. No gallstones, gallbladder wall thickening, or biliary dilatation. Pancreas: Unremarkable. No pancreatic ductal dilatation or surrounding inflammatory changes. Spleen: Normal in size without focal abnormality. Adrenals/Urinary Tract: Adrenal glands are unremarkable. The right kidney appears slightly swollen and demonstrates moderate surrounding fat stranding and perinephric edema. No renal calculi or hydronephrosis is identified on the right. No evidence of abscess formation. The left kidney appears normal, without renal calculi, focal lesion, or hydronephrosis. Bladder is unremarkable. Stomach/Bowel: Stomach is within normal limits. Appendix appears normal. No evidence of bowel wall thickening, distention, or inflammatory changes. Vascular/Lymphatic: No  significant vascular findings are present. No enlarged abdominal or pelvic lymph nodes. Reproductive: Uterus and bilateral adnexa are unremarkable. Other: No abdominal wall hernia or abnormality. No abdominopelvic ascites. Musculoskeletal: No acute or significant osseous findings. IMPRESSION: Enlargement of the right kidney with associated fat stranding is nonspecific but may represent acute pyelonephritis. Electronically Signed   By: Romona Curls M.D.   On: 06/20/2021 16:00    Procedures Procedures   Medications Ordered in ED Medications  sodium chloride 0.9 % bolus 1,000 mL (1,000 mLs Intravenous New Bag/Given 06/20/21 1503)  ondansetron (ZOFRAN) injection 4 mg (4 mg Intravenous Given 06/20/21 1503)  morphine 4 MG/ML injection 4 mg (4 mg Intravenous Given 06/20/21 1505)  acetaminophen (TYLENOL) tablet 650 mg (650 mg Oral Given 06/20/21 1503)    ED Course  I have reviewed the triage vital signs and the nursing notes.  Pertinent labs & imaging results that were available during my care of the patient were reviewed by me and considered in my medical decision making (see chart for details).    MDM Rules/Calculators/A&P  Patient was evaluated by both me and Dr. Durwin Nora at the bedside.  She appeared to be in moderate pain.  Her history and urinalysis suggestive of kidney infection.  Patient currently afebrile however may have been experiencing fevers at home.  She was given Tylenol for temperature control as well as morphine for pain management.  Patient's creatinine was 1.3 and GFR very slightly reduced.  CT abdomen revealing of right sided renal enlargement and fat stranding consistent with acute pyelonephritis.  I believe the patient to have originally had a UTI however her use of Azo and Goody powder may have further injured her kidneys.  Because she is not tolerating p.o. intake I  believe she is a candidate for hospital admission to treat her pyelonephritis.  I discussed this with the patient and  she is understanding.  I spoke with Dr. Roda Shutters who has accepted the patient for admission at [facility] emergency department.  Blood cultures have been added to her work-up.  Patient notified of her acceptance and transfer.  Reported less pain after morphine administration.  Denies any needs or questions at this time.  Stable for transfer.  Final Clinical Impression(s) / ED Diagnoses Final diagnoses:  Pyelonephritis    Rx / DC Orders Admit to hospital for further treatment of pyelonephritis.    Saddie Benders, PA-C 06/20/21 1741    Rolan Bucco, MD 06/20/21 Ernestina [place]

## 2021-06-20 NOTE — Progress Notes (Signed)
25 year old  o significant past medical history presented to med [employer] with nausea vomiting abdominal pain, found to have acute pyelonephritis, with leukocytosis and AKI, she is received IV antibiotic and IV fluids, she is not able to tolerate p.o., admit to MedSurg.

## 2021-06-20 NOTE — H&P (Signed)
[name]:[id number] DOB: 1996-04-10 DOA: 06/20/2021     PCP: [facility] [place]      Patient arrived to ER on 06/20/21 at 1239 Referred by Attending Toy Baker, MD   Patient coming from: home Lives  With family    Chief Complaint:   Chief Complaint  Patient presents with   Abdominal Pain    HPI: [name] is a 25 y.o. female with medical history significant of hernia repair as a baby,    Presented with   5 days of NVD and RLQ pain report symptoms of UTI Tried to to use AZO, she tried to use Tylenol for pain but ran ut and started to use Goody pwds reports aches and chills   fever earlier in the week    Reports nausea is getting a bit better, diarrhea has stopped  Occasional etoh no tobacco  Has  NOt been vaccinated against COVID     Initial COVID TEST  NEGATIVE   Lab Results  Component Value Date   [place] 06/20/2021   [place] NEGATIVE 05/07/2021      While in ER: Hypokalemia Aki with Cr up to 1.3 WBC up to 15.2    ED Triage Vitals  Enc Vitals Group     BP 06/20/21 1251 96/72     Pulse Rate 06/20/21 1251 (!) 118     Resp 06/20/21 1251 16     Temp 06/20/21 1251 100 F (37.8 C)     Temp Source 06/20/21 1251 Oral     SpO2 06/20/21 1251 94 %     Weight 06/20/21 1247 260 lb (117.9 kg)     Height 06/20/21 1247 _0  (1.727 m)     Head Circumference --      Peak Flow --      Pain Score 06/20/21 1247 9     Pain Loc --      Pain Edu? --      Excl. in [place]? --   TMAX(24)@     _________________________________________ Significant initial  Findings: Abnormal Labs Reviewed  COMPREHENSIVE METABOLIC PANEL - Abnormal; Notable for the following components:      Result Value   Sodium 134 (*)    Potassium 3.3 (*)    Glucose, Bld 108 (*)    Creatinine, Ser 1.30 (*)    Total Protein 8.5 (*)    Total Bilirubin 1.4 (*)    GFR, Estimated 59 (*)    All other components within normal limits  CBC WITH  DIFFERENTIAL/PLATELET - Abnormal; Notable for the following components:   WBC 15.2 (*)    Neutro Abs 12.7 (*)    Monocytes Absolute 1.1 (*)    Abs Immature Granulocytes 0.09 (*)    All other components within normal limits  URINALYSIS, ROUTINE W REFLEX MICROSCOPIC - Abnormal; Notable for the following components:   Color, Urine ORANGE (*)    APPearance TURBID (*)    Glucose, UA   (*)    Value: TEST NOT REPORTED DUE TO COLOR INTERFERENCE OF URINE PIGMENT   Hgb urine dipstick   (*)    Value: TEST NOT REPORTED DUE TO COLOR INTERFERENCE OF URINE PIGMENT   Bilirubin Urine   (*)    Value: TEST NOT REPORTED DUE TO COLOR INTERFERENCE OF URINE PIGMENT   Ketones, ur   (*)    Value: TEST NOT REPORTED DUE TO COLOR INTERFERENCE OF URINE PIGMENT   Protein, ur   (*)  Value: TEST NOT REPORTED DUE TO COLOR INTERFERENCE OF URINE PIGMENT   Nitrite   (*)    Value: TEST NOT REPORTED DUE TO COLOR INTERFERENCE OF URINE PIGMENT   Leukocytes,Ua   (*)    Value: TEST NOT REPORTED DUE TO COLOR INTERFERENCE OF URINE PIGMENT   All other components within normal limits  URINALYSIS, MICROSCOPIC (REFLEX) - Abnormal; Notable for the following components:   Bacteria, UA MANY (*)    All other components within normal limits   ____________________________________________ Ordered    CTabd/pelvis - Enlargement of the right kidney with associated fat stranding is nonspecific but may represent acute pyelonephritis.   _____ ____________ This patient meets SIRS Criteria and may be septic.    The recent clinical data is shown below. Vitals:   06/20/21 1251 06/20/21 1428 06/20/21 1500 06/20/21 1607  BP: 96/72 101/61 108/70 109/66  Pulse: (!) 118 96 94 96  Resp: _0 Temp: 100 F (37.8 C)     TempSrc: Oral     SpO2: 94% 98% 99% 100%  Weight:      Height:        WBC     Component Value Date/Time   WBC 15.2 (H) 06/20/2021 1407   LYMPHSABS 1.3 06/20/2021 1407   MONOABS 1.1 (H) 06/20/2021 1407    EOSABS 0.0 06/20/2021 1407   BASOSABS 0.0 06/20/2021 1407     Lactic Acid, Venous No results found for: LATICACIDVEN   Procalcitonin  Ordered     UA  evidence of UTI      Urine analysis:    Component Value Date/Time   COLORURINE ORANGE (A) 06/20/2021 1344   APPEARANCEUR TURBID (A) 06/20/2021 1344   LABSPEC  06/20/2021 1344    TEST NOT REPORTED DUE TO COLOR INTERFERENCE OF URINE PIGMENT   PHURINE  06/20/2021 1344    TEST NOT REPORTED DUE TO COLOR INTERFERENCE OF URINE PIGMENT   GLUCOSEU (A) 06/20/2021 1344    TEST NOT REPORTED DUE TO COLOR INTERFERENCE OF URINE PIGMENT   HGBUR (A) 06/20/2021 1344    TEST NOT REPORTED DUE TO COLOR INTERFERENCE OF URINE PIGMENT   BILIRUBINUR (A) 06/20/2021 1344    TEST NOT REPORTED DUE TO COLOR INTERFERENCE OF URINE PIGMENT   KETONESUR (A) 06/20/2021 1344    TEST NOT REPORTED DUE TO COLOR INTERFERENCE OF URINE PIGMENT   PROTEINUR (A) 06/20/2021 1344    TEST NOT REPORTED DUE TO COLOR INTERFERENCE OF URINE PIGMENT   UROBILINOGEN 1.0 02/04/2015 0930   NITRITE (A) 06/20/2021 1344    TEST NOT REPORTED DUE TO COLOR INTERFERENCE OF URINE PIGMENT   LEUKOCYTESUR (A) 06/20/2021 1344    TEST NOT REPORTED DUE TO COLOR INTERFERENCE OF URINE PIGMENT    Results for orders placed or performed during the hospital encounter of 06/20/21  Resp Panel by RT-PCR (Flu A&B, Covid) Nasopharyngeal Swab     Status: None   Collection Time: 06/20/21  4:24 PM   Specimen: Nasopharyngeal Swab; Nasopharyngeal(NP) swabs in vial transport medium  Result Value Ref Range Status   SARS Coronavirus 2 by RT PCR NEGATIVE NEGATIVE Final         Influenza A by PCR NEGATIVE NEGATIVE Final   Influenza B by PCR NEGATIVE NEGATIVE Final           _______________________________________________ Hospitalist was called for admission for pyelonephritis  The following Work up has been ordered so far:  Orders Placed This Encounter  Procedures   Urine Culture  Resp Panel by RT-PCR  (Flu A&B, Covid) Nasopharyngeal Swab   Culture, blood (routine x 2)   CT ABDOMEN PELVIS WO CONTRAST   Comprehensive metabolic panel   Lipase, blood   CBC with Diff   Urinalysis, Routine w reflex microscopic   Pregnancy, urine   Urinalysis, Microscopic (reflex)   Consult to hospitalist  Acute pyelonephritis with AKI   Insert peripheral IV   Admit to Inpatient (patient's expected length of stay will be greater than 2 midnights or inpatient only procedure)    Following Medications were ordered in ER: Medications  sodium chloride 0.9 % bolus 1,000 mL (0 mLs Intravenous Stopped 06/20/21 1632)  ondansetron (ZOFRAN) injection 4 mg (4 mg Intravenous Given 06/20/21 1503)  morphine 4 MG/ML injection 4 mg (4 mg Intravenous Given 06/20/21 1505)  acetaminophen (TYLENOL) tablet 650 mg (650 mg Oral Given 06/20/21 1503)  cefTRIAXone (ROCEPHIN) 1 g in sodium chloride 0.9 % 100 mL IVPB (0 g Intravenous Stopped 06/20/21 1707)        Consult Orders  (From admission, onward)           Start     Ordered   06/20/21 1626  Consult to hospitalist  Acute pyelonephritis with AKI Called [facility] for Consult talked to [facility] at 4:37  Once       Comments: Acute pyelonephritis with AKI  Provider:  (Not yet assigned)  Question Answer Comment  Place call to: [facility]   Reason for Consult Admit      06/20/21 1626            OTHER Significant initial  Findings:  labs showing:  Recent Labs  Lab 06/20/21 1407 06/20/21 1933  NA 134* 138  K 3.3* 3.4*  CO2 25 26  GLUCOSE 108* 109*  BUN 12 11  CREATININE 1.30* 1.07*  CALCIUM 9.1 8.9  MG  --  2.0  PHOS  --  2.4*    Cr    Up from baseline see below Lab Results  Component Value Date   CREATININE 1.30 (H) 06/20/2021   CREATININE 0.62 08/14/2018   CREATININE 0.53 (L) 03/10/2018    Recent Labs  Lab 06/20/21 1407  AST 26  ALT 38  ALKPHOS 90  BILITOT 1.4*  PROT 8.5*  ALBUMIN 3.8   Lab Results  Component Value Date   CALCIUM 8.9  06/20/2021   PHOS 2.4 (L) 06/20/2021          Plt: Lab Results  Component Value Date   PLT 320 06/20/2021     Recent Labs  Lab 06/20/21 1407  WBC 15.2*  NEUTROABS 12.7*  HGB 12.2  HCT 37.7  MCV 84.9  PLT 320    HG/HCT  stable,       Component Value Date/Time   HGB 12.2 06/20/2021 1407   HGB 10.8 (L) 04/07/2018 1058   HGB 12.2 12/23/2017 0000   HCT 37.7 06/20/2021 1407   HCT 34.9 04/07/2018 1058   HCT 37 12/23/2017 0000   MCV 84.9 06/20/2021 1407   MCV 82 04/07/2018 1058     Recent Labs  Lab 06/20/21 1407  LIPASE 24    Cardiac Panel (last 3 results) Recent Labs    06/20/21 1933  CKTOTAL 31*         Cultures:    Component Value Date/Time   SDES URINE, RANDOM 02/04/2015 0938   SPECREQUEST NONE 02/04/2015 0938   CULT  02/04/2015 0938    Multiple bacterial morphotypes present, none predominant.  Suggest appropriate recollection if clinically indicated. Performed at [place] 02/05/2015 FINAL 02/04/2015 1884     Radiological Exams on Admission: CT ABDOMEN PELVIS WO CONTRAST  Result Date: 06/20/2021 CLINICAL DATA:  Acute abdominal pain for 5 days. EXAM: CT ABDOMEN AND PELVIS WITHOUT CONTRAST TECHNIQUE: Multidetector CT imaging of the abdomen and pelvis was performed following the standard protocol without IV contrast. COMPARISON:  CT abdomen pelvis dated 02/04/2015. FINDINGS: Lower chest: No acute abnormality. Hepatobiliary: No focal liver abnormality is seen. No gallstones, gallbladder wall thickening, or biliary dilatation. Pancreas: Unremarkable. No pancreatic ductal dilatation or surrounding inflammatory changes. Spleen: Normal in size without focal abnormality. Adrenals/Urinary Tract: Adrenal glands are unremarkable. The right kidney appears slightly swollen and demonstrates moderate surrounding fat stranding and perinephric edema. No renal calculi or hydronephrosis is identified on the right. No evidence of abscess formation. The  left kidney appears normal, without renal calculi, focal lesion, or hydronephrosis. Bladder is unremarkable. Stomach/Bowel: Stomach is within normal limits. Appendix appears normal. No evidence of bowel wall thickening, distention, or inflammatory changes. Vascular/Lymphatic: No significant vascular findings are present. No enlarged abdominal or pelvic lymph nodes. Reproductive: Uterus and bilateral adnexa are unremarkable. Other: No abdominal wall hernia or abnormality. No abdominopelvic ascites. Musculoskeletal: No acute or significant osseous findings. IMPRESSION: Enlargement of the right kidney with associated fat stranding is nonspecific but may represent acute pyelonephritis. Electronically Signed   By: Zerita Boers M.D.   On: 06/20/2021 16:00   _______________________________________________________________________________________________________ Latest  Blood pressure 109/66, pulse 96, temperature 100 F (37.8 C), temperature source Oral, resp. rate 15, height _0  (1.727 m), weight 117.9 kg, last menstrual period 06/08/2021, SpO2 100 %, currently breastfeeding.   Review of Systems:    Pertinent positives include:  chills, fatigue,   dysuria,   Constitutional:  No weight loss, night sweats, Fevers, weight loss  HEENT:  No headaches, Difficulty swallowing,Tooth/dental problems,Sore throat,  No sneezing, itching, ear ache, nasal congestion, post nasal drip,  Cardio-vascular:  No chest pain, Orthopnea, PND, anasarca, dizziness, palpitations.no Bilateral lower extremity swelling  GI:  No heartburn, indigestion, abdominal pain, nausea, vomiting, diarrhea, change in bowel habits, loss of appetite, melena, blood in stool, hematemesis Resp:  no shortness of breath at rest. No dyspnea on exertion, No excess mucus, no productive cough, No non-productive cough, No coughing up of blood.No change in color of mucus.No wheezing. Skin:  no rash or lesions. No jaundice GU:  nochange in color of  urine, no urgency or frequency. No straining to urinate.  No flank pain.  Musculoskeletal:  No joint pain or no joint swelling. No decreased range of motion. No back pain.  Psych:  No change in mood or affect. No depression or anxiety. No memory loss.  Neuro: no localizing neurological complaints, no tingling, no weakness, no double vision, no gait abnormality, no slurred speech, no confusion  All systems reviewed and apart from [place] all are negative _______________________________________________________________________________________________ Past Medical History:   Past Medical History:  Diagnosis Date   Medical history non-contributory    Migraine     Past Surgical History:  Procedure Laterality Date   HERNIA REPAIR     UMBILICAL HERNIA REPAIR      Social History:  Ambulatory independently     reports that she has never smoked. She has never used smokeless tobacco. She reports that she does not drink alcohol and does not use drugs.   Family History:   Family History  Problem Relation Age  of Onset   Cancer Father        stomach   Cancer Other        lymphoma   Diabetes Other    ______________________________________________________________________________________________ Allergies: Allergies  Allergen Reactions   Other     MSG, processed foods     Prior to Admission medications   Medication Sig Start Date End Date Taking? Authorizing Provider  Misc. Devices (BREAST PUMP) MISC 1 Device by Other route as needed. 05/23/18   Shelly Bombard, MD  norethindrone (MICRONOR,CAMILA,ERRIN) 0.35 MG tablet Take 1 tablet (0.35 mg total) by mouth daily. 06/08/18   Chancy Milroy, MD  ondansetron (ZOFRAN ODT) 8 MG disintegrating tablet Take 1 tablet (8 mg total) by mouth every 8 (eight) hours as needed for nausea or vomiting. 03/08/19   Lacretia Leigh, MD  ondansetron (ZOFRAN-ODT) 4 MG disintegrating tablet Take 1 tablet (4 mg total) by mouth every 8 (eight) hours as needed for  nausea or vomiting. 12/06/18   Vanessa Kick, MD  prochlorperazine (COMPAZINE) 10 MG tablet Take 1 tablet (10 mg total) by mouth 2 (two) times daily as needed for nausea or vomiting (or headache). 05/07/21   Gareth Morgan, MD    ___________________________________________________________________________________________________ Physical Exam: Vitals with BMI 06/20/2021 06/20/2021 06/20/2021  Height - - -  Weight - - -  BMI - - -  Systolic 326 712 458  Diastolic 66 70 61  Pulse 96 94 96     1. General:  in No  Acute distress    Chronically ill  -appearing 2. Psychological: Alert and   Oriented 3. Head/ENT:    Dry Mucous Membranes                          Head Non traumatic, neck supple                          Normal  Dentition 4. SKIN:  decreased Skin turgor,  Skin clean Dry and intact no rash 5. Heart: Regular rate and rhythm no  Murmur, no Rub or gallop 6. Lungs:  Clear to auscultation bilaterally, no wheezes or crackles   7. Abdomen: Soft,  non-tender, Non distended   obese costovertebral tenderness on the right 8. Lower extremities: no clubbing, cyanosis, no  edema 9. Neurologically Grossly intact, moving all 4 extremities equally   10. MSK: Normal range of motion    Chart has been reviewed  ______________________________________________________________________________________________  Assessment/Plan  25 y.o. female with medical history significant of hernia repair    Admitted for pyelonephritis  Present on Admission:  Pyelonephritis -   treat with Rocephin         await results of urine culture and adjust antibiotic coverage as needed   Sepsis ([place]) -  -SIRS criteria met with  elevated white blood cell count,       Component Value Date/Time   WBC 15.2 (H) 06/20/2021 1407   LYMPHSABS 1.3 06/20/2021 1407     tachycardia   ,   fever at home   The recent clinical data is shown below. Vitals:   06/20/21 1251 06/20/21 1428 06/20/21 1500 06/20/21 1607  BP: 96/72  101/61 108/70 109/66  Pulse: (!) 118 96 94 96  Resp: _0 Temp: 100 F (37.8 C)     TempSrc: Oral     SpO2: 94% 98% 99% 100%  Weight:      Height:         -  Most likely source being:  urinary,      - Obtain serial lactic acid and procalcitonin level.  - Initiated IV antibiotics in ER: Antibiotics Given (last 72 hours)     Date/Time Action Medication Dose Rate   06/20/21 1637 New Bag/Given   cefTRIAXone (ROCEPHIN) 1 g in sodium chloride 0.9 % 100 mL IVPB 1 g 200 mL/hr       Will continue  on :Rocephin   - await results of blood and urine culture  - Rehydrate aggressively  Intravenous fluids were administered,      7:21 PM   AKI (acute kidney injury) ([place]) - will rehydrate, follow Cr renal function improving with IV fluids   Hypokalemia - - will replace and repeat in AM,  check magnesium level and replace as needed Replace phosphate  Other plan as per orders.  DVT prophylaxis:   Lovenox      Code Status:    Code Status: Prior FULL CODE   as per patient   I had personally discussed CODE STATUS with patient     Family Communication:   Family not at  Bedside  plan of care was discussed on the phone with  mother  Disposition Plan:         To home once workup is complete and patient is stable   Following barriers for discharge:                            Electrolytes corrected                                                            Pain controlled with PO medications                               white count improving able to transition to PO antibiotics                             Will need to be able to tolerate PO                               Consults called: none  Admission status:  ED Disposition     ED Disposition  Admit   Condition  --   [place]: [place] [[id number]]  Level of Care: Med-Surg [16]  May admit patient to [facility] or [facility] if equivalent level of care is available:: Yes  Interfacility  transfer: Yes  Covid Evaluation: Asymptomatic Screening Protocol (No Symptoms)  Diagnosis: Pyelonephritis [195093]  Admitting Physician: Florencia Reasons [[id number]]  Attending Physician: Florencia Reasons [[id number]]  Estimated length of stay: past midnight tomorrow  Certification:: I certify this patient will need inpatient services for at least 2 midnights             inpatient     I Expect 2 midnight stay secondary to severity of patient's current illness need for inpatient interventions justified by the following:  hemodynamic instability despite optimal treatment (tachycardia )  Severe lab/radiological/exam abnormalities including:    pyelonephritis     That  are currently affecting medical management.   I expect  patient to be hospitalized for 2 midnights requiring inpatient medical care.  Patient is at high risk for adverse outcome (such as loss of life or disability) if not treated.     Need for IV antibiotics, IV fluids,      Level of care      medical floor      Lab Results  Component Value Date   [place] 06/20/2021     Precautions: admitted as   Covid Negative    PPE: Used by the provider:   N95  eye Goggles,  Gloves  Zacaria Pousson 06/20/2021, 9:34 PM    [facility]     after 2 AM please page floor coverage PA If 7AM-7PM, please contact the day team taking care of the patient using [email or web address]   Patient was evaluated in the context of the global COVID-19 pandemic, which necessitated consideration that the patient might be at risk for infection with the SARS-CoV-2 virus that causes COVID-19. Institutional protocols and algorithms that pertain to the evaluation of patients at risk for COVID-19 are in a state of rapid change based on information released by regulatory bodies including the CDC and federal and state organizations. These policies and algorithms were followed during the patient's care.

## 2021-06-20 NOTE — ED Notes (Signed)
Report called to Arday RN at [facility]

## 2021-06-20 NOTE — ED Triage Notes (Signed)
Pt reports RLQ pain x 5d; +NVD; sts she can keep food and liquids down, but just feels sick after eating

## 2021-06-20 NOTE — ED Notes (Signed)
CT complete

## 2021-06-20 NOTE — Plan of Care (Signed)
  Problem: Coping: Goal: Level of anxiety will decrease Outcome: Progressing   Problem: Pain Managment: Goal: General experience of comfort will improve Outcome: Progressing   

## 2021-06-21 DIAGNOSIS — A419 Sepsis, unspecified organism: Secondary | ICD-10-CM

## 2021-06-21 DIAGNOSIS — E876 Hypokalemia: Secondary | ICD-10-CM

## 2021-06-21 LAB — COMPREHENSIVE METABOLIC PANEL WITH GFR
ALT: 31 U/L (ref 0–44)
AST: 20 U/L (ref 15–41)
Albumin: 3.5 g/dL (ref 3.5–5.0)
Alkaline Phosphatase: 78 U/L (ref 38–126)
Anion gap: 11 (ref 5–15)
BUN: 10 mg/dL (ref 6–20)
CO2: 24 mmol/L (ref 22–32)
Calcium: 8.5 mg/dL — ABNORMAL LOW (ref 8.9–10.3)
Chloride: 103 mmol/L (ref 98–111)
Creatinine, Ser: 1.11 mg/dL — ABNORMAL HIGH (ref 0.44–1.00)
GFR, Estimated: >60 mL/min
Glucose, Bld: 114 mg/dL — ABNORMAL HIGH (ref 70–99)
Potassium: 3.8 mmol/L (ref 3.5–5.1)
Sodium: 138 mmol/L (ref 135–145)
Total Bilirubin: 1.6 mg/dL — ABNORMAL HIGH (ref 0.3–1.2)
Total Protein: 7.9 g/dL (ref 6.5–8.1)

## 2021-06-21 LAB — CBC WITH DIFFERENTIAL/PLATELET
Abs Immature Granulocytes: 0.1 10*3/uL — ABNORMAL HIGH (ref 0.00–0.07)
Basophils Absolute: 0 10*3/uL (ref 0.0–0.1)
Basophils Relative: 0 %
Eosinophils Absolute: 0 10*3/uL (ref 0.0–0.5)
Eosinophils Relative: 0 %
HCT: 35.9 % — ABNORMAL LOW (ref 36.0–46.0)
Hemoglobin: 11.2 g/dL — ABNORMAL LOW (ref 12.0–15.0)
Immature Granulocytes: 1 %
Lymphocytes Relative: 12 %
Lymphs Abs: 1.3 10*3/uL (ref 0.7–4.0)
MCH: 27.1 pg (ref 26.0–34.0)
MCHC: 31.2 g/dL (ref 30.0–36.0)
MCV: 86.7 fL (ref 80.0–100.0)
Monocytes Absolute: 1.2 10*3/uL — ABNORMAL HIGH (ref 0.1–1.0)
Monocytes Relative: 11 %
Neutro Abs: 8.5 10*3/uL — ABNORMAL HIGH (ref 1.7–7.7)
Neutrophils Relative %: 76 %
Platelets: 291 10*3/uL (ref 150–400)
RBC: 4.14 MIL/uL (ref 3.87–5.11)
RDW: 14.7 % (ref 11.5–15.5)
WBC: 11.1 10*3/uL — ABNORMAL HIGH (ref 4.0–10.5)
nRBC: 0 % (ref 0.0–0.2)

## 2021-06-21 LAB — HIV ANTIBODY (ROUTINE TESTING W REFLEX): HIV Screen 4th Generation wRfx: NONREACTIVE

## 2021-06-21 LAB — TSH: TSH: 0.495 u[IU]/mL (ref 0.350–4.500)

## 2021-06-21 LAB — MAGNESIUM: Magnesium: 2 mg/dL (ref 1.7–2.4)

## 2021-06-21 LAB — PHOSPHORUS: Phosphorus: 2.6 mg/dL (ref 2.5–4.6)

## 2021-06-21 MED ORDER — SODIUM CHLORIDE 0.9 % IV SOLN: INTRAVENOUS | Status: AC

## 2021-06-21 MED ORDER — POTASSIUM CHLORIDE CRYS ER 20 MEQ PO TBCR
20.0000 meq | EXTENDED_RELEASE_TABLET | Freq: Once | ORAL | Status: AC
  Administered 2021-06-21: 20 meq via ORAL
  Filled 2021-06-21: qty 1

## 2021-06-21 MED ORDER — ACETAMINOPHEN 325 MG PO TABS
650.0000 mg | ORAL_TABLET | Freq: Once | ORAL | Status: AC
  Administered 2021-06-21: 650 mg via ORAL
  Filled 2021-06-21: qty 2

## 2021-06-21 MED ORDER — DIPHENHYDRAMINE HCL 50 MG/ML IJ SOLN
12.5000 mg | Freq: Four times a day (QID) | INTRAMUSCULAR | Status: DC | PRN
  Administered 2021-06-21: 12.5 mg via INTRAVENOUS
  Filled 2021-06-21 (×2): qty 1

## 2021-06-21 NOTE — Progress Notes (Signed)
   06/21/21 1720  Assess: MEWS Score  Temp (!) 102.9 F (39.4 C)  BP 121/66  Pulse Rate 90  Resp 18  Level of Consciousness Alert  SpO2 94 %  O2 Device Room Air  Assess: MEWS Score  MEWS Temp 2  MEWS Systolic 0  MEWS Pulse 0  MEWS RR 0  MEWS LOC 0  MEWS Score 2  MEWS Score Color Yellow  Assess: if the MEWS score is Yellow or Red  Were vital signs taken at a resting state? Yes  Focused Assessment No change from prior assessment  Does the patient meet 2 or more of the SIRS criteria? No  Does the patient have a confirmed or suspected source of infection? Yes  Provider and Rapid Response Notified? Yes  MEWS guidelines implemented *See Row Information* No, previously yellow, continue vital signs every 4 hours  Treat  Pain Scale 0-10  Pain Score 6  Pain Type Acute pain  Pain Location Abdomen  Pain Orientation Lower  Pain Descriptors / Indicators Aching;Constant  Pain Frequency Intermittent  Pain Onset With Activity  Patients Stated Pain Goal 2  Pain Intervention(s) Medication (See eMAR)  Multiple Pain Sites No  Take Vital Signs  Increase Vital Sign Frequency  Yellow: Q 2hr X 2 then Q 4hr X 2, if remains yellow, continue Q 4hrs  Escalate  MEWS: Escalate Yellow: discuss with charge nurse/RN and consider discussing with provider and RRT  Notify: Charge Nurse/RN  Name of Charge Nurse/RN Notified Carey Bullocks RN  Date Charge Nurse/RN Notified 06/21/21  Time Charge Nurse/RN Notified 1734  Notify: Provider  Provider Name/Title Osvaldo Shipper  Date Provider Notified 06/21/21  Time Provider Notified 1734  Notification Type Call  Notification Reason Other (Comment) (fever 102.9)  Provider response See new orders  Date of Provider Response 06/21/21  Time of Provider Response 1734  Document  Patient Outcome Other (Comment) (pt remained stable throughout)  Assess: SIRS CRITERIA  SIRS Temperature  1  SIRS Pulse 0  SIRS Respirations  0  SIRS WBC 0  SIRS Score Sum  1     Md notified of repeat yellow mews with fever of 102.9. Md ordered extra dose of Tylenol. Pt stable at this time with no changes from previous assessment. Rn will continue to monitor.

## 2021-06-21 NOTE — Plan of Care (Signed)
  Problem: Clinical Measurements: Goal: Respiratory complications will improve Outcome: Progressing   Problem: Clinical Measurements: Goal: Cardiovascular complication will be avoided Outcome: Progressing   Problem: Elimination: Goal: Will not experience complications related to bowel motility Outcome: Progressing   Problem: Skin Integrity: Goal: Risk for impaired skin integrity will decrease Outcome: Progressing   

## 2021-06-21 NOTE — Progress Notes (Signed)
[facility] PROGRESS NOTE   [name]:[id number] DOB: 12/01/1995 DOA: 06/20/2021  PCP: [facility] [place]  Brief History/Interval Summary: 25 year old African-American female with past medical history of hernia repair as a child who presented with 5-day history of nausea vomiting right-sided abdominal pain.  Was found to have acute pyelonephritis.  She was hospitalized for further management.  Reason for Visit: Acute pyelonephritis  Consultants: None  Procedures: None    Subjective/Interval History: Patient mentions she is feeling slightly better today compared to yesterday.  Had some nausea last night but no vomiting.  Pain is well controlled today compared to yesterday.  Denies any dysuria.  No blood in the urine currently     Assessment/Plan:  Acute pyelonephritis involving the right kidney/sepsis, present on admission Procalcitonin 1.85.  Lactic acid level was normal.  WBC noted to be elevated at 15.2.  Improved 11.1 today.  Continues to have fever.  Follow-up on urine and blood cultures.  CT scan did not show any abscess in the kidney.  She remains on ceftriaxone.  Symptomatic treatment. Sepsis physiology appears to have improved.  She was tachycardic at the time of admission.  Nausea and vomiting Secondary to above. Symptomatic treatment.  Mild acute kidney injury/Hypokalemia Appears to have improved with hydration.  Monitor urine output.  Potassium level has improved.  Magnesium is 2.0.  Normocytic anemia Drop in hemoglobin is likely dilutional.  Recheck tomorrow.  Obesity Estimated body mass index is 39.53 kg/m as calculated from the following:   Height as of this encounter: 5\' 8"  (1.727 m).   Weight as of this encounter: 117.9 kg.   DVT Prophylaxis: Lovenox Code Status: Full code Family Communication: Discussed with the patient Disposition Plan: Hopefully return home when improved.  Mobilize.  Out of bed to  chair.  Status is: Inpatient  Remains inpatient appropriate because:Ongoing active pain requiring inpatient pain management, IV treatments appropriate due to intensity of illness or inability to take PO, and Inpatient level of care appropriate due to severity of illness  Dispo: The patient is from: Home              Anticipated d/c is to: Home              Patient currently is not medically stable to d/c.   Difficult to place patient No        Medications: Scheduled:  enoxaparin (LOVENOX) injection  60 mg Subcutaneous Q24H   Continuous:  sodium chloride 125 mL/hr at 06/21/21 0247   cefTRIAXone (ROCEPHIN)  IV     08/21/21 chloride, acetaminophen **OR** acetaminophen, HYDROcodone-acetaminophen, morphine injection, ondansetron (ZOFRAN) IV  Antibiotics: Anti-infectives (From admission, onward)    Start     Dose/Rate Route Frequency Ordered Stop   06/21/21 1600  cefTRIAXone (ROCEPHIN) 1 g in sodium chloride 0.9 % 100 mL IVPB        1 g 200 mL/hr over 30 Minutes Intravenous Every 24 hours 06/20/21 1902     06/20/21 1630  cefTRIAXone (ROCEPHIN) 1 g in sodium chloride 0.9 % 100 mL IVPB        1 g 200 mL/hr over 30 Minutes Intravenous  Once 06/20/21 1629 06/20/21 1707       Objective:  Vital Signs  Vitals:   06/20/21 2226 06/21/21 0033 06/21/21 0245 06/21/21 0511  BP: (!) 112/51 (!) 88/51 123/71 (!) 101/50  Pulse: 99 95 91 99  Resp: 17 16 17 16   Temp: (!) 102.9 F (39.4  C) 100 F (37.8 C) 98.5 F (36.9 C) (!) 100.7 F (38.2 C)  TempSrc: Oral Oral Oral Oral  SpO2: 100% 100% 100% 100%  Weight:      Height:        Intake/Output Summary (Last 24 hours) at 06/21/2021 1003 Last data filed at 06/21/2021 0716 Gross per 24 hour  Intake 2864.83 ml  Output 400 ml  Net 2464.83 ml   Filed Weights   06/20/21 1247  Weight: 117.9 kg    General appearance: Awake alert.  In no distress Resp: Clear to auscultation bilaterally.  Normal effort Cardio: S1-S2 is normal  regular.  No S3-S4.  No rubs murmurs or bruit GI: Abdomen is soft.  Mildly tender in the right without any rebound rigidity or guarding.  Bowel sounds present.  No masses organomegaly.  No CVA tenderness noted. Extremities: No edema.  Full range of motion of lower extremities. Neurologic: Alert and oriented x3.  No focal neurological deficits.    Lab Results:  Data Reviewed: I have personally reviewed following labs and imaging studies  CBC: Recent Labs  Lab 06/20/21 1407 06/21/21 0344  WBC 15.2* 11.1*  NEUTROABS 12.7* 8.5*  HGB 12.2 11.2*  HCT 37.7 35.9*  MCV 84.9 86.7  PLT 320 291    Basic Metabolic Panel: Recent Labs  Lab 06/20/21 1407 06/20/21 1933 06/21/21 0344  NA 134* 138 138  K 3.3* 3.4* 3.8  CL 100 103 103  CO2 25 26 24   GLUCOSE 108* 109* 114*  BUN 12 11 10   CREATININE 1.30* 1.07* 1.11*  CALCIUM 9.1 8.9 8.5*  MG  --  2.0 2.0  PHOS  --  2.4* 2.6    GFR: Estimated Creatinine Clearance: 104.6 mL/min (A) (by C-G formula based on SCr of 1.11 mg/dL (H)).  Liver Function Tests: Recent Labs  Lab 06/20/21 1407 06/21/21 0344  AST 26 20  ALT 38 31  ALKPHOS 90 78  BILITOT 1.4* 1.6*  PROT 8.5* 7.9  ALBUMIN 3.8 3.5    Recent Labs  Lab 06/20/21 1407  LIPASE 24     Cardiac Enzymes: Recent Labs  Lab 06/20/21 1933  CKTOTAL 31*     Thyroid Function Tests: Recent Labs    06/21/21 0344  TSH 0.495      Recent Results (from the past 240 hour(s))  Resp Panel by RT-PCR (Flu A&B, Covid) Nasopharyngeal Swab     Status: None   Collection Time: 06/20/21  4:24 PM   Specimen: Nasopharyngeal Swab; Nasopharyngeal(NP) swabs in vial transport medium  Result Value Ref Range Status   SARS Coronavirus 2 by RT PCR NEGATIVE NEGATIVE Final    Comment: (NOTE) SARS-CoV-2 target nucleic acids are NOT DETECTED.  The SARS-CoV-2 RNA is generally detectable in upper respiratory specimens during the acute phase of infection. The lowest concentration of SARS-CoV-2  viral copies this assay can detect is 138 copies/mL. A negative result does not preclude SARS-Cov-2 infection and should not be used as the sole basis for treatment or other patient management decisions. A negative result may occur with  improper specimen collection/handling, submission of specimen other than nasopharyngeal swab, presence of viral mutation(s) within the areas targeted by this assay, and inadequate number of viral copies(<138 copies/mL). A negative result must be combined with clinical observations, patient history, and epidemiological information. The expected result is Negative.  Fact Sheet for Patients:  08/21/21  Fact Sheet for Healthcare Providers:  08/20/21  This test is no t yet approved or cleared by  the [employer] and  has been authorized for detection and/or diagnosis of SARS-CoV-2 by FDA under an Emergency Use Authorization (EUA). This EUA will remain  in effect (meaning this test can be used) for the duration of the COVID-19 declaration under Section 564(b)(1) of the Act, 21 U.S.C.section 360bbb-3(b)(1), unless the authorization is terminated  or revoked sooner.       Influenza A by PCR NEGATIVE NEGATIVE Final   Influenza B by PCR NEGATIVE NEGATIVE Final    Comment: (NOTE) The Xpert Xpress SARS-CoV-2/FLU/RSV plus assay is intended as an aid in the diagnosis of influenza from Nasopharyngeal swab specimens and should not be used as a sole basis for treatment. Nasal washings and aspirates are unacceptable for Xpert Xpress SARS-CoV-2/FLU/RSV testing.  Fact Sheet for Patients: BloggerCourse.com  Fact Sheet for Healthcare Providers: SeriousBroker.it  This test is not yet approved or cleared by the [place] FDA and has been authorized for detection and/or diagnosis of SARS-CoV-2 by FDA under an Emergency Use Authorization  (EUA). This EUA will remain in effect (meaning this test can be used) for the duration of the COVID-19 declaration under Section 564(b)(1) of the Act, 21 U.S.C. section 360bbb-3(b)(1), unless the authorization is terminated or revoked.  Performed at [facility], [address] [place], [place] [address]   Culture, blood (routine x 2)     Status: None (Preliminary result)   Collection Time: 06/20/21  5:45 PM   Specimen: BLOOD LEFT HAND  Result Value Ref Range Status   Specimen Description   Final    BLOOD LEFT HAND Performed at [facility], [address] [place], [place] [address]    Special Requests   Final    Blood Culture adequate volume BOTTLES DRAWN AEROBIC AND ANAEROBIC Performed at [facility], [address] [place], [place] [address]    Culture PENDING  Incomplete   Report Status PENDING  Incomplete  Culture, blood (routine x 2)     Status: None (Preliminary result)   Collection Time: 06/20/21  6:00 PM   Specimen: BLOOD LEFT ARM  Result Value Ref Range Status   Specimen Description   Final    BLOOD LEFT ARM Performed at [facility], [address], [place], [place] [address]    Special Requests   Final    BOTTLES DRAWN AEROBIC AND ANAEROBIC Blood Culture results may not be optimal due to an inadequate volume of blood received in culture bottles Performed at [facility], [address] [place], [place] [address]    Culture PENDING  Incomplete   Report Status PENDING  Incomplete      Radiology Studies: CT ABDOMEN PELVIS WO CONTRAST  Result Date: 06/20/2021 CLINICAL DATA:  Acute abdominal pain for 5 days. EXAM: CT ABDOMEN AND PELVIS WITHOUT CONTRAST TECHNIQUE: Multidetector CT imaging of the abdomen and pelvis was performed following the standard protocol without IV contrast. COMPARISON:  CT abdomen pelvis dated 02/04/2015. FINDINGS: Lower chest: No acute abnormality. Hepatobiliary: No focal liver abnormality is  seen. No gallstones, gallbladder wall thickening, or biliary dilatation. Pancreas: Unremarkable. No pancreatic ductal dilatation or surrounding inflammatory changes. Spleen: Normal in size without focal abnormality. Adrenals/Urinary Tract: Adrenal glands are unremarkable. The right kidney appears slightly swollen and demonstrates moderate surrounding fat stranding and perinephric edema. No renal calculi or hydronephrosis is identified on the right. No evidence of abscess formation. The left kidney appears normal, without renal calculi, focal lesion,  or hydronephrosis. Bladder is unremarkable. Stomach/Bowel: Stomach is within normal limits. Appendix appears normal. No evidence of bowel wall thickening, distention, or inflammatory changes. Vascular/Lymphatic: No significant vascular findings are present. No enlarged abdominal or pelvic lymph nodes. Reproductive: Uterus and bilateral adnexa are unremarkable. Other: No abdominal wall hernia or abnormality. No abdominopelvic ascites. Musculoskeletal: No acute or significant osseous findings. IMPRESSION: Enlargement of the right kidney with associated fat stranding is nonspecific but may represent acute pyelonephritis. Electronically Signed   By: Romona Curls M.D.   On: 06/20/2021 16:00       LOS: 1 day   Agam Davenport [employer] on [email or web address]  06/21/2021, 10:03 AM

## 2021-06-21 NOTE — Progress Notes (Signed)
Md notified of pt continued fevers. Other vital signs remain stable. Pt remains stable with no needs. Rn will continue to monitor.

## 2021-06-22 LAB — BASIC METABOLIC PANEL
Anion gap: 5 (ref 5–15)
BUN: 6 mg/dL (ref 6–20)
CO2: 23 mmol/L (ref 22–32)
Calcium: 8 mg/dL — ABNORMAL LOW (ref 8.9–10.3)
Chloride: 107 mmol/L (ref 98–111)
Creatinine, Ser: 0.91 mg/dL (ref 0.44–1.00)
GFR, Estimated: >60 mL/min (ref >60)
Glucose, Bld: 119 mg/dL — ABNORMAL HIGH (ref 70–99)
Potassium: 3.4 mmol/L — ABNORMAL LOW (ref 3.5–5.1)
Sodium: 135 mmol/L (ref 135–145)

## 2021-06-22 LAB — CBC
HCT: 31.2 % — ABNORMAL LOW (ref 36.0–46.0)
Hemoglobin: 9.9 g/dL — ABNORMAL LOW (ref 12.0–15.0)
MCH: 27.3 pg (ref 26.0–34.0)
MCHC: 31.7 g/dL (ref 30.0–36.0)
MCV: 86.2 fL (ref 80.0–100.0)
Platelets: 290 10*3/uL (ref 150–400)
RBC: 3.62 MIL/uL — ABNORMAL LOW (ref 3.87–5.11)
RDW: 14.6 % (ref 11.5–15.5)
WBC: 8.8 10*3/uL (ref 4.0–10.5)
nRBC: 0 % (ref 0.0–0.2)

## 2021-06-22 LAB — URINE CULTURE: Culture: >=100000 — AB

## 2021-06-22 MED ORDER — CEFAZOLIN SODIUM-DEXTROSE 2-4 GM/100ML-% IV SOLN
2.0000 g | Freq: Three times a day (TID) | INTRAVENOUS | Status: DC
  Administered 2021-06-22 – 2021-06-24 (×6): 2 g via INTRAVENOUS
  Filled 2021-06-22 (×6): qty 100

## 2021-06-22 MED ORDER — POTASSIUM CHLORIDE CRYS ER 20 MEQ PO TBCR
40.0000 meq | EXTENDED_RELEASE_TABLET | Freq: Once | ORAL | Status: AC
  Administered 2021-06-22: 40 meq via ORAL
  Filled 2021-06-22: qty 2

## 2021-06-22 MED ORDER — SODIUM CHLORIDE 0.9 % IV SOLN: INTRAVENOUS | Status: AC

## 2021-06-22 NOTE — Plan of Care (Signed)
?  Problem: Activity: ?Goal: Risk for activity intolerance will decrease ?Outcome: Progressing ?  ?Problem: Safety: ?Goal: Ability to remain free from injury will improve ?Outcome: Progressing ?  ?Problem: Pain Managment: ?Goal: General experience of comfort will improve ?Outcome: Progressing ?  ?

## 2021-06-22 NOTE — Progress Notes (Addendum)
[facility] PROGRESS NOTE   [name] XBD:[id number] DOB: Jan 15, 1996 DOA: 06/20/2021  PCP: [facility] [place]  Brief History/Interval Summary: 25 year old African-American female with past medical history of hernia repair as a child who presented with 5-day history of nausea vomiting right-sided abdominal pain.  Was found to have acute pyelonephritis.  She was hospitalized for further management.  Reason for Visit: Acute pyelonephritis  Consultants: None  Procedures: None    Subjective/Interval History: Patient mentions that she continues to have some nausea occasional dizziness.  No vomiting yet.  Also had pain in the right flank area.  Denies any blood in the urine.     Assessment/Plan:  Acute pyelonephritis involving the right kidney/sepsis, present on admission CT scan raise concern for pyelonephritis but did not show any abscess.  Procalcitonin 1.85.  Lactic acid level was normal.  WBC noted to be elevated at 15.2.   Patient was started on ceftriaxone.  WBC has responded.  She was noted to have multiple episodes of fever yesterday.  Appears to be improving now.  Follow-up on blood cultures.  Urine cultures positive for E. coli.  Noted to be pansensitive.  Since she is still has significant nausea will leave her on IV antibiotics for today and consider transition to oral tomorrow. Sepsis physiology appears to have improved.  She was tachycardic at the time of admission. Symptomatic treatment to continue.  Continue IV fluids for now.  Nausea and vomiting Secondary to above. Symptomatic treatment.  Mild acute kidney injury/Hypokalemia Resolved with IV hydration.  Potassium to be repleted.  Magnesium was 2.0 yesterday.  Normocytic anemia Up in hemoglobin is dilutional.  No overt blood loss noted.  Continue to monitor.  Obesity Estimated body mass index is 39.53 kg/m as calculated from the following:   Height as of this encounter: 5\' 8"   (1.727 m).   Weight as of this encounter: 117.9 kg.   DVT Prophylaxis: Lovenox Code Status: Full code Family Communication: Discussed with the patient Disposition Plan: Hopefully return home when improved.  Mobilize with assistance and supervision.  Out of bed to chair.  Status is: Inpatient  Remains inpatient appropriate because:Ongoing active pain requiring inpatient pain management, IV treatments appropriate due to intensity of illness or inability to take PO, and Inpatient level of care appropriate due to severity of illness  Dispo: The patient is from: Home              Anticipated d/c is to: Home              Patient currently is not medically stable to d/c.   Difficult to place patient No        Medications: Scheduled:  enoxaparin (LOVENOX) injection  60 mg Subcutaneous Q24H   Continuous:  cefTRIAXone (ROCEPHIN)  IV Stopped (06/21/21 1549)   08/21/21 **OR** acetaminophen, diphenhydrAMINE, HYDROcodone-acetaminophen, morphine injection, ondansetron (ZOFRAN) IV  Antibiotics: Anti-infectives (From admission, onward)    Start     Dose/Rate Route Frequency Ordered Stop   06/21/21 1600  cefTRIAXone (ROCEPHIN) 1 g in sodium chloride 0.9 % 100 mL IVPB        1 g 200 mL/hr over 30 Minutes Intravenous Every 24 hours 06/20/21 1902     06/20/21 1630  cefTRIAXone (ROCEPHIN) 1 g in sodium chloride 0.9 % 100 mL IVPB        1 g 200 mL/hr over 30 Minutes Intravenous  Once 06/20/21 1629 06/20/21 1707       Objective:  Vital Signs  Vitals:   06/21/21 2124 06/22/21 0132 06/22/21 0539 06/22/21 0954  BP: 102/60 112/67 131/75 128/63  Pulse: 83 95 97 88  Resp: 17 18 16 18   Temp: 98.6 F (37 C) 99.7 F (37.6 C) (!) 100.7 F (38.2 C) 99.8 F (37.7 C)  TempSrc: Oral Oral Oral Oral  SpO2: 91% 94% 97% 94%  Weight:      Height:        Intake/Output Summary (Last 24 hours) at 06/22/2021 1128 Last data filed at 06/22/2021 0600 Gross per 24 hour  Intake 2816.1 ml   Output --  Net 2816.1 ml    Filed Weights   06/20/21 1247  Weight: 117.9 kg    General appearance: Awake alert.  In no distress Resp: Clear to auscultation bilaterally.  Normal effort Cardio: S1-S2 is normal regular.  No S3-S4.  No rubs murmurs or bruit GI: Abdomen is soft.  Slightly tender in the right flank area without any rebound rigidity or guarding.  No masses organomegaly.  Bowel sounds present normal. Extremities: No edema.  Full range of motion of lower extremities. Neurologic: Alert and oriented x3.  No focal neurological deficits.     Lab Results:  Data Reviewed: I have personally reviewed following labs and imaging studies  CBC: Recent Labs  Lab 06/20/21 1407 06/21/21 0344 06/22/21 0339  WBC 15.2* 11.1* 8.8  NEUTROABS 12.7* 8.5*  --   HGB 12.2 11.2* 9.9*  HCT 37.7 35.9* 31.2*  MCV 84.9 86.7 86.2  PLT 320 291 290     Basic Metabolic Panel: Recent Labs  Lab 06/20/21 1407 06/20/21 1933 06/21/21 0344 06/22/21 0339  NA 134* 138 138 135  K 3.3* 3.4* 3.8 3.4*  CL 100 103 103 107  CO2 25 26 24 23   GLUCOSE 108* 109* 114* 119*  BUN 12 11 10 6   CREATININE 1.30* 1.07* 1.11* 0.91  CALCIUM 9.1 8.9 8.5* 8.0*  MG  --  2.0 2.0  --   PHOS  --  2.4* 2.6  --      GFR: Estimated Creatinine Clearance: 127.6 mL/min (by C-G formula based on SCr of 0.91 mg/dL).  Liver Function Tests: Recent Labs  Lab 06/20/21 1407 06/21/21 0344  AST 26 20  ALT 38 31  ALKPHOS 90 78  BILITOT 1.4* 1.6*  PROT 8.5* 7.9  ALBUMIN 3.8 3.5     Recent Labs  Lab 06/20/21 1407  LIPASE 24      Cardiac Enzymes: Recent Labs  Lab 06/20/21 1933  CKTOTAL 31*      Thyroid Function Tests: Recent Labs    06/21/21 0344  TSH 0.495       Recent Results (from the past 240 hour(s))  Urine Culture     Status: Abnormal   Collection Time: 06/20/21  1:44 PM   Specimen: Urine, Clean Catch  Result Value Ref Range Status   Specimen Description   Final    URINE, CLEAN  CATCH Performed at [facility], [address] [place], [address] [facility]    Special Requests   Final    NONE Performed at [facility], [address] [place], [facility] [place]    Culture >=100,000 COLONIES/mL ESCHERICHIA COLI (A)  Final   Report Status 06/22/2021 FINAL  Final   Organism ID, Bacteria ESCHERICHIA COLI (A)  Final      Susceptibility   Escherichia coli - MIC*    AMPICILLIN <=2 SENSITIVE Sensitive  CEFAZOLIN <=4 SENSITIVE Sensitive     CEFEPIME <=0.12 SENSITIVE Sensitive     CEFTRIAXONE <=0.25 SENSITIVE Sensitive     CIPROFLOXACIN <=0.25 SENSITIVE Sensitive     GENTAMICIN <=1 SENSITIVE Sensitive     IMIPENEM <=0.25 SENSITIVE Sensitive     NITROFURANTOIN <=16 SENSITIVE Sensitive     TRIMETH/SULFA <=20 SENSITIVE Sensitive     AMPICILLIN/SULBACTAM <=2 SENSITIVE Sensitive     PIP/TAZO <=4 SENSITIVE Sensitive     * >=100,000 COLONIES/mL ESCHERICHIA COLI  Resp Panel by RT-PCR (Flu A&B, Covid) Nasopharyngeal Swab     Status: None   Collection Time: 06/20/21  4:24 PM   Specimen: Nasopharyngeal Swab; Nasopharyngeal(NP) swabs in vial transport medium  Result Value Ref Range Status   SARS Coronavirus 2 by RT PCR NEGATIVE NEGATIVE Final    Comment: (NOTE) SARS-CoV-2 target nucleic acids are NOT DETECTED.  The SARS-CoV-2 RNA is generally detectable in upper respiratory specimens during the acute phase of infection. The lowest concentration of SARS-CoV-2 viral copies this assay can detect is 138 copies/mL. A negative result does not preclude SARS-Cov-2 infection and should not be used as the sole basis for treatment or other patient management decisions. A negative result may occur with  improper specimen collection/handling, submission of specimen other than nasopharyngeal swab, presence of viral mutation(s) within the areas targeted by this assay, and inadequate number of viral copies(<138 copies/mL). A negative result must be combined  with clinical observations, patient history, and epidemiological information. The expected result is Negative.  Fact Sheet for Patients:  BloggerCourse.com  Fact Sheet for Healthcare Providers:  SeriousBroker.it  This test is no t yet approved or cleared by the [place] FDA and  has been authorized for detection and/or diagnosis of SARS-CoV-2 by FDA under an Emergency Use Authorization (EUA). This EUA will remain  in effect (meaning this test can be used) for the duration of the COVID-19 declaration under Section 564(b)(1) of the Act, 21 U.S.C.section 360bbb-3(b)(1), unless the authorization is terminated  or revoked sooner.       Influenza A by PCR NEGATIVE NEGATIVE Final   Influenza B by PCR NEGATIVE NEGATIVE Final    Comment: (NOTE) The Xpert Xpress SARS-CoV-2/FLU/RSV plus assay is intended as an aid in the diagnosis of influenza from Nasopharyngeal swab specimens and should not be used as a sole basis for treatment. Nasal washings and aspirates are unacceptable for Xpert Xpress SARS-CoV-2/FLU/RSV testing.  Fact Sheet for Patients: BloggerCourse.com  Fact Sheet for Healthcare Providers: SeriousBroker.it  This test is not yet approved or cleared by the [place] FDA and has been authorized for detection and/or diagnosis of SARS-CoV-2 by FDA under an Emergency Use Authorization (EUA). This EUA will remain in effect (meaning this test can be used) for the duration of the COVID-19 declaration under Section 564(b)(1) of the Act, 21 U.S.C. section 360bbb-3(b)(1), unless the authorization is terminated or revoked.  Performed at [facility], [address] [place], [place] [address]   Culture, blood (routine x 2)     Status: None (Preliminary result)   Collection Time: 06/20/21  5:45 PM   Specimen: BLOOD LEFT HAND  Result Value Ref Range Status    Specimen Description   Final    BLOOD LEFT HAND Performed at [facility], [address] [place], [place] [address]    Special Requests   Final    Blood Culture adequate volume BOTTLES DRAWN AEROBIC AND ANAEROBIC Performed at [facility], [phone]  [employer] [address] [place], [place] [address]    Culture   Final    NO GROWTH 1 DAY Performed at [facility], [address], [place], [place] [address]    Report Status PENDING  Incomplete  Culture, blood (routine x 2)     Status: None (Preliminary result)   Collection Time: 06/20/21  6:00 PM   Specimen: BLOOD LEFT ARM  Result Value Ref Range Status   Specimen Description   Final    BLOOD LEFT ARM Performed at [facility], [address], [place], [place] [address]    Special Requests   Final    BOTTLES DRAWN AEROBIC AND ANAEROBIC Blood Culture results may not be optimal due to an inadequate volume of blood received in culture bottles Performed at [facility], [address] [place], [place] [address]    Culture   Final    NO GROWTH 1 DAY Performed at [facility], [address] [place], [place] [address]    Report Status PENDING  Incomplete       Radiology Studies: CT ABDOMEN PELVIS WO CONTRAST  Result Date: 06/20/2021 CLINICAL DATA:  Acute abdominal pain for 5 days. EXAM: CT ABDOMEN AND PELVIS WITHOUT CONTRAST TECHNIQUE: Multidetector CT imaging of the abdomen and pelvis was performed following the standard protocol without IV contrast. COMPARISON:  CT abdomen pelvis dated 02/04/2015. FINDINGS: Lower chest: No acute abnormality. Hepatobiliary: No focal liver abnormality is seen. No gallstones, gallbladder wall thickening, or biliary dilatation. Pancreas: Unremarkable. No pancreatic ductal dilatation or surrounding inflammatory changes. Spleen: Normal in size without focal abnormality. Adrenals/Urinary Tract: Adrenal glands are unremarkable. The right kidney appears slightly swollen and  demonstrates moderate surrounding fat stranding and perinephric edema. No renal calculi or hydronephrosis is identified on the right. No evidence of abscess formation. The left kidney appears normal, without renal calculi, focal lesion, or hydronephrosis. Bladder is unremarkable. Stomach/Bowel: Stomach is within normal limits. Appendix appears normal. No evidence of bowel wall thickening, distention, or inflammatory changes. Vascular/Lymphatic: No significant vascular findings are present. No enlarged abdominal or pelvic lymph nodes. Reproductive: Uterus and bilateral adnexa are unremarkable. Other: No abdominal wall hernia or abnormality. No abdominopelvic ascites. Musculoskeletal: No acute or significant osseous findings. IMPRESSION: Enlargement of the right kidney with associated fat stranding is nonspecific but may represent acute pyelonephritis. Electronically Signed   By: Romona Curls M.D.   On: 06/20/2021 16:00       LOS: 2 days   [name]  [facility] Pager on [email or web address]  06/22/2021, 11:28 AM

## 2021-06-23 LAB — BASIC METABOLIC PANEL
Anion gap: 6 (ref 5–15)
BUN: 6 mg/dL (ref 6–20)
CO2: 24 mmol/L (ref 22–32)
Calcium: 8.2 mg/dL — ABNORMAL LOW (ref 8.9–10.3)
Chloride: 105 mmol/L (ref 98–111)
Creatinine, Ser: 0.93 mg/dL (ref 0.44–1.00)
GFR, Estimated: >60 mL/min (ref >60)
Glucose, Bld: 99 mg/dL (ref 70–99)
Potassium: 3.6 mmol/L (ref 3.5–5.1)
Sodium: 135 mmol/L (ref 135–145)

## 2021-06-23 LAB — CBC
HCT: 31.3 % — ABNORMAL LOW (ref 36.0–46.0)
Hemoglobin: 9.9 g/dL — ABNORMAL LOW (ref 12.0–15.0)
MCH: 27 pg (ref 26.0–34.0)
MCHC: 31.6 g/dL (ref 30.0–36.0)
MCV: 85.5 fL (ref 80.0–100.0)
Platelets: 305 10*3/uL (ref 150–400)
RBC: 3.66 MIL/uL — ABNORMAL LOW (ref 3.87–5.11)
RDW: 14.5 % (ref 11.5–15.5)
WBC: 8.9 10*3/uL (ref 4.0–10.5)
nRBC: 0 % (ref 0.0–0.2)

## 2021-06-23 LAB — MAGNESIUM: Magnesium: 2.1 mg/dL (ref 1.7–2.4)

## 2021-06-23 MED ORDER — MELATONIN 3 MG PO TABS
3.0000 mg | ORAL_TABLET | Freq: Every day | ORAL | Status: DC
  Administered 2021-06-23: 3 mg via ORAL
  Filled 2021-06-23: qty 1

## 2021-06-23 MED ORDER — POTASSIUM CHLORIDE CRYS ER 20 MEQ PO TBCR
40.0000 meq | EXTENDED_RELEASE_TABLET | Freq: Once | ORAL | Status: AC
  Administered 2021-06-23: 40 meq via ORAL
  Filled 2021-06-23: qty 2

## 2021-06-23 NOTE — Progress Notes (Addendum)
[facility] PROGRESS NOTE   CYRILLA DURKIN VWU:[id number] DOB: Jul 12, 1996 DOA: 06/20/2021  PCP: [facility] [place]  Brief History/Interval Summary: 25 year old African-American female with past medical history of hernia repair as a child who presented with 5-day history of nausea vomiting right-sided abdominal pain.  Was found to have acute pyelonephritis.  She was hospitalized for further management.  Reason for Visit: Acute pyelonephritis  Consultants: None  Procedures: None    Subjective/Interval History: Patient had a better night last night compared to yesterday.  No further episodes of dizziness or lightheadedness.  Some nausea.  No vomiting.  Pain is better.  Continues to feel very fatigued.  Low-grade fever noted this morning.       Assessment/Plan:  Acute pyelonephritis involving the right kidney/sepsis secondary to E. coli, present on admission CT scan raise concern for pyelonephritis but did not show any abscess.  Procalcitonin 1.85.  Lactic acid level was normal.  WBC noted to be elevated at 15.2.   Patient started on ceftriaxone.  Urine cultures grew E. coli.  Blood cultures negative.  Sensitivities reviewed.  Changed over to cefazolin.  If she is able to tolerate diet today she can be changed over to oral antibiotics tomorrow.   Sepsis physiology appears to have improved.  She was tachycardic at the time of admission.  WBC is now normal.  Continues to have low-grade fever. Mobilize as tolerated.  Cut back on IV fluids.  Nausea and vomiting Secondary to above. Symptomatic treatment.  Advance diet.  Mild acute kidney injury/Hypokalemia Resolved with IV hydration.  Potassium has improved.  Will give additional dose today.  Magnesium 2.1.    Normocytic anemia Drop in hemoglobin is dilutional.  No overt blood loss noted.  Continue to monitor.    Obesity Estimated body mass index is 39.53 kg/m as calculated from the following:    Height as of this encounter: 5\' 8"  (1.727 m).   Weight as of this encounter: 117.9 kg.   DVT Prophylaxis: Lovenox Code Status: Full code Family Communication: Discussed with the patient Disposition Plan: Hopefully return home when improved.  Mobilize with assistance and supervision.  Out of bed to chair.  Status is: Inpatient  Remains inpatient appropriate because:Ongoing active pain requiring inpatient pain management, IV treatments appropriate due to intensity of illness or inability to take PO, and Inpatient level of care appropriate due to severity of illness  Dispo: The patient is from: Home              Anticipated d/c is to: Home              Patient currently is not medically stable to d/c.   Difficult to place patient No        Medications: Scheduled:  enoxaparin (LOVENOX) injection  60 mg Subcutaneous Q24H   melatonin  3 mg Oral QHS   potassium chloride  40 mEq Oral Once   Continuous:  sodium chloride 75 mL/hr at 06/22/21 2101    ceFAZolin (ANCEF) IV 2 g (06/23/21 0520)   08/23/21 **OR** acetaminophen, diphenhydrAMINE, HYDROcodone-acetaminophen, morphine injection, ondansetron (ZOFRAN) IV  Antibiotics: Anti-infectives (From admission, onward)    Start     Dose/Rate Route Frequency Ordered Stop   06/22/21 1400  ceFAZolin (ANCEF) IVPB 2g/100 mL premix        2 g 200 mL/hr over 30 Minutes Intravenous Every 8 hours 06/22/21 1318     06/21/21 1600  cefTRIAXone (ROCEPHIN) 1 g in sodium chloride  0.9 % 100 mL IVPB  Status:  Discontinued        1 g 200 mL/hr over 30 Minutes Intravenous Every 24 hours 06/20/21 1902 06/22/21 1318   06/20/21 1630  cefTRIAXone (ROCEPHIN) 1 g in sodium chloride 0.9 % 100 mL IVPB        1 g 200 mL/hr over 30 Minutes Intravenous  Once 06/20/21 1629 06/20/21 1707       Objective:  Vital Signs  Vitals:   06/22/21 1715 06/22/21 2131 06/23/21 0453 06/23/21 0518  BP: 126/90 128/67 126/75   Pulse: 80 85 62 75  Resp:  17 17    Temp: 99.1 F (37.3 C) 100 F (37.8 C) (!) 100.5 F (38.1 C)   TempSrc:  Oral Oral   SpO2: 98% 92% (!) 89% 98%  Weight:      Height:        Intake/Output Summary (Last 24 hours) at 06/23/2021 0958 Last data filed at 06/23/2021 0200 Gross per 24 hour  Intake 1689.83 ml  Output --  Net 1689.83 ml    Filed Weights   06/20/21 1247  Weight: 117.9 kg    General appearance: Awake alert.  In no distress Resp: Clear to auscultation bilaterally.  Normal effort Cardio: S1-S2 is normal regular.  No S3-S4.  No rubs murmurs or bruit GI: Abdomen is soft.  Nontender nondistended.  Bowel sounds are present normal.  No masses organomegaly Extremities: No edema.  Full range of motion of lower extremities. Neurologic: Alert and oriented x3.  No focal neurological deficits.     Lab Results:  Data Reviewed: I have personally reviewed following labs and imaging studies  CBC: Recent Labs  Lab 06/20/21 1407 06/21/21 0344 06/22/21 0339 06/23/21 0338  WBC 15.2* 11.1* 8.8 8.9  NEUTROABS 12.7* 8.5*  --   --   HGB 12.2 11.2* 9.9* 9.9*  HCT 37.7 35.9* 31.2* 31.3*  MCV 84.9 86.7 86.2 85.5  PLT 320 291 290 305     Basic Metabolic Panel: Recent Labs  Lab 06/20/21 1407 06/20/21 1933 06/21/21 0344 06/22/21 0339 06/23/21 0338  NA 134* 138 138 135 135  K 3.3* 3.4* 3.8 3.4* 3.6  CL 100 103 103 107 105  CO2 25 26 24 23 24   GLUCOSE 108* 109* 114* 119* 99  BUN 12 11 10 6 6   CREATININE 1.30* 1.07* 1.11* 0.91 0.93  CALCIUM 9.1 8.9 8.5* 8.0* 8.2*  MG  --  2.0 2.0  --  2.1  PHOS  --  2.4* 2.6  --   --      GFR: Estimated Creatinine Clearance: 124.8 mL/min (by C-G formula based on SCr of 0.93 mg/dL).  Liver Function Tests: Recent Labs  Lab 06/20/21 1407 06/21/21 0344  AST 26 20  ALT 38 31  ALKPHOS 90 78  BILITOT 1.4* 1.6*  PROT 8.5* 7.9  ALBUMIN 3.8 3.5     Recent Labs  Lab 06/20/21 1407  LIPASE 24      Cardiac Enzymes: Recent Labs  Lab 06/20/21 1933  CKTOTAL  31*      Thyroid Function Tests: Recent Labs    06/21/21 0344  TSH 0.495       Recent Results (from the past 240 hour(s))  Urine Culture     Status: Abnormal   Collection Time: 06/20/21  1:44 PM   Specimen: Urine, Clean Catch  Result Value Ref Range Status   Specimen Description   Final    URINE, CLEAN CATCH Performed at  [facility], [address] [place], [place] [address]    Special Requests   Final    NONE Performed at [facility], [address] [place], [place] [address]    Culture >=100,000 COLONIES/mL ESCHERICHIA COLI (A)  Final   Report Status 06/22/2021 FINAL  Final   Organism ID, Bacteria ESCHERICHIA COLI (A)  Final      Susceptibility   Escherichia coli - MIC*    AMPICILLIN <=2 SENSITIVE Sensitive     CEFAZOLIN <=4 SENSITIVE Sensitive     CEFEPIME <=0.12 SENSITIVE Sensitive     CEFTRIAXONE <=0.25 SENSITIVE Sensitive     CIPROFLOXACIN <=0.25 SENSITIVE Sensitive     GENTAMICIN <=1 SENSITIVE Sensitive     IMIPENEM <=0.25 SENSITIVE Sensitive     NITROFURANTOIN <=16 SENSITIVE Sensitive     TRIMETH/SULFA <=20 SENSITIVE Sensitive     AMPICILLIN/SULBACTAM <=2 SENSITIVE Sensitive     PIP/TAZO <=4 SENSITIVE Sensitive     * >=100,000 COLONIES/mL ESCHERICHIA COLI  Resp Panel by RT-PCR (Flu A&B, Covid) Nasopharyngeal Swab     Status: None   Collection Time: 06/20/21  4:24 PM   Specimen: Nasopharyngeal Swab; Nasopharyngeal(NP) swabs in vial transport medium  Result Value Ref Range Status   SARS Coronavirus 2 by RT PCR NEGATIVE NEGATIVE Final    Comment: (NOTE) SARS-CoV-2 target nucleic acids are NOT DETECTED.  The SARS-CoV-2 RNA is generally detectable in upper respiratory specimens during the acute phase of infection. The lowest concentration of SARS-CoV-2 viral copies this assay can detect is 138 copies/mL. A negative result does not preclude SARS-Cov-2 infection and should not be used as the sole basis for treatment or other patient  management decisions. A negative result may occur with  improper specimen collection/handling, submission of specimen other than nasopharyngeal swab, presence of viral mutation(s) within the areas targeted by this assay, and inadequate number of viral copies(<138 copies/mL). A negative result must be combined with clinical observations, patient history, and epidemiological information. The expected result is Negative.  Fact Sheet for Patients:  BloggerCourse.com  Fact Sheet for Healthcare Providers:  SeriousBroker.it  This test is no t yet approved or cleared by the [place] FDA and  has been authorized for detection and/or diagnosis of SARS-CoV-2 by FDA under an Emergency Use Authorization (EUA). This EUA will remain  in effect (meaning this test can be used) for the duration of the COVID-19 declaration under Section 564(b)(1) of the Act, 21 U.S.C.section 360bbb-3(b)(1), unless the authorization is terminated  or revoked sooner.       Influenza A by PCR NEGATIVE NEGATIVE Final   Influenza B by PCR NEGATIVE NEGATIVE Final    Comment: (NOTE) The Xpert Xpress SARS-CoV-2/FLU/RSV plus assay is intended as an aid in the diagnosis of influenza from Nasopharyngeal swab specimens and should not be used as a sole basis for treatment. Nasal washings and aspirates are unacceptable for Xpert Xpress SARS-CoV-2/FLU/RSV testing.  Fact Sheet for Patients: BloggerCourse.com  Fact Sheet for Healthcare Providers: SeriousBroker.it  This test is not yet approved or cleared by the [place] FDA and has been authorized for detection and/or diagnosis of SARS-CoV-2 by FDA under an Emergency Use Authorization (EUA). This EUA will remain in effect (meaning this test can be used) for the duration of the COVID-19 declaration under Section 564(b)(1) of the Act, 21 U.S.C. section 360bbb-3(b)(1),  unless the authorization is terminated or revoked.  Performed at [facility], [address] [place],  [place] [address]   Culture, blood (routine x 2)     Status: None (Preliminary result)   Collection Time: 06/20/21  5:45 PM   Specimen: BLOOD LEFT HAND  Result Value Ref Range Status   Specimen Description   Final    BLOOD LEFT HAND Performed at [facility], [address] [place], [place] [address]    Special Requests   Final    Blood Culture adequate volume BOTTLES DRAWN AEROBIC AND ANAEROBIC Performed at [facility], [address] [place], [place] [address]    Culture   Final    NO GROWTH 2 DAYS Performed at [facility], [address], [place], [place] [address]    Report Status PENDING  Incomplete  Culture, blood (routine x 2)     Status: None (Preliminary result)   Collection Time: 06/20/21  6:00 PM   Specimen: BLOOD LEFT ARM  Result Value Ref Range Status   Specimen Description   Final    BLOOD LEFT ARM Performed at [facility], [address], [place], [place] [address]    Special Requests   Final    BOTTLES DRAWN AEROBIC AND ANAEROBIC Blood Culture results may not be optimal due to an inadequate volume of blood received in culture bottles Performed at [facility], [address] [place], [place] [address]    Culture   Final    NO GROWTH 2 DAYS Performed at [facility], [address] [place], [place] [address]    Report Status PENDING  Incomplete       Radiology Studies: No results found.     LOS: 3 days   Diane Hanel [employer] on [email or web address]  06/23/2021, 9:58 AM

## 2021-06-23 NOTE — Plan of Care (Signed)

## 2021-06-24 DIAGNOSIS — N179 Acute kidney failure, unspecified: Secondary | ICD-10-CM

## 2021-06-24 LAB — CBC
HCT: 31.6 % — ABNORMAL LOW (ref 36.0–46.0)
Hemoglobin: 9.9 g/dL — ABNORMAL LOW (ref 12.0–15.0)
MCH: 26.7 pg (ref 26.0–34.0)
MCHC: 31.3 g/dL (ref 30.0–36.0)
MCV: 85.2 fL (ref 80.0–100.0)
Platelets: 363 10*3/uL (ref 150–400)
RBC: 3.71 MIL/uL — ABNORMAL LOW (ref 3.87–5.11)
RDW: 14.6 % (ref 11.5–15.5)
WBC: 10.1 10*3/uL (ref 4.0–10.5)
nRBC: 0 % (ref 0.0–0.2)

## 2021-06-24 LAB — BASIC METABOLIC PANEL
Anion gap: 9 (ref 5–15)
BUN: 5 mg/dL — ABNORMAL LOW (ref 6–20)
CO2: 21 mmol/L — ABNORMAL LOW (ref 22–32)
Calcium: 8.6 mg/dL — ABNORMAL LOW (ref 8.9–10.3)
Chloride: 108 mmol/L (ref 98–111)
Creatinine, Ser: 0.8 mg/dL (ref 0.44–1.00)
GFR, Estimated: >60 mL/min (ref >60)
Glucose, Bld: 132 mg/dL — ABNORMAL HIGH (ref 70–99)
Potassium: 3.8 mmol/L (ref 3.5–5.1)
Sodium: 138 mmol/L (ref 135–145)

## 2021-06-24 MED ORDER — HYDROCODONE-ACETAMINOPHEN 5-325 MG PO TABS: 1.0000 | ORAL_TABLET | ORAL | 0 refills | Status: AC | PRN

## 2021-06-24 MED ORDER — CEPHALEXIN 250 MG PO CAPS: 250.0000 mg | ORAL_CAPSULE | Freq: Four times a day (QID) | ORAL | 0 refills | Status: AC

## 2021-06-24 NOTE — Discharge Summary (Signed)
Physician Discharge Summary  [name] CWC:[id number] DOB: Sep 23, 1995 DOA: 06/20/2021  PCP: [facility] [place]  Admit date: 06/20/2021 Discharge date: 06/24/2021  Admitted From: Home Disposition: Home  Recommendations for Outpatient Follow-up:  Follow up with PCP in 1-2 weeks Please obtain BMP/CBC in one week  Discharge Condition: Stable CODE STATUS: Full Diet recommendation: Regular  Brief/Interim Summary: 25 year old African-American female with past medical history of hernia repair as a child who presented with 5-day history of nausea vomiting right-sided abdominal pain.  Was found to have acute pyelonephritis.  She was hospitalized for further management.  Acute pyelonephritis involving the right kidney/sepsis secondary to E. coli, present on admission Transition to p.o. cephalexin at discharge to complete antibiotic course, follow-up with PCP in 1 week's time for symptom evaluation to ensure resolution of infection  Nausea and vomiting, resolved Secondary to above. Symptomatic treatment.  Advance diet.   Mild acute kidney injury/Hypokalemia, resolved Follow-up labs with PCP next week  Normocytic anemia Hemodilutional, follow-up labs with PCP next week   Obesity Estimated body mass index is 39.53 kg/m as calculated from the following:   Height as of this encounter: 5\' 8"  (1.727 m).   Weight as of this encounter: 117.9 kg.    Discharge Instructions  Discharge Instructions     Diet - low sodium heart healthy   Complete by: As directed    Increase activity slowly   Complete by: As directed       Allergies as of 06/24/2021       Reactions   Ibuprofen Nausea Only   Other    MSG, processed foods        Medication List     TAKE these medications    acetaminophen 500 MG tablet Commonly known as: TYLENOL Take 500 mg by mouth every 6 (six) hours as needed for moderate pain.   AZO Cranberry Urinary Tract 250-60 MG Caps Generic  drug: Cranberry-Vitamin C Take 2 capsules by mouth 3 (three) times daily as needed (uti prevention).   BC HEADACHE PO Take 1 packet by mouth daily as needed (pain).   Breast Pump Misc 1 Device by Other route as needed.   cephALEXin 250 MG capsule Commonly known as: KEFLEX Take 1 capsule (250 mg total) by mouth 4 (four) times daily for 10 days.   HYDROcodone-acetaminophen 5-325 MG tablet Commonly known as: NORCO/VICODIN Take 1-2 tablets by mouth every 4 (four) hours as needed for moderate pain.   norethindrone 0.35 MG tablet Commonly known as: MICRONOR Take 1 tablet (0.35 mg total) by mouth daily.   ondansetron 4 MG disintegrating tablet Commonly known as: ZOFRAN-ODT Take 1 tablet (4 mg total) by mouth every 8 (eight) hours as needed for nausea or vomiting.   ondansetron 8 MG disintegrating tablet Commonly known as: Zofran ODT Take 1 tablet (8 mg total) by mouth every 8 (eight) hours as needed for nausea or vomiting.   prochlorperazine 10 MG tablet Commonly known as: COMPAZINE Take 1 tablet (10 mg total) by mouth 2 (two) times daily as needed for nausea or vomiting (or headache).        Allergies  Allergen Reactions   Ibuprofen Nausea Only   Other     MSG, processed foods    Consultations: None  Procedures/Studies: CT ABDOMEN PELVIS WO CONTRAST  Result Date: 06/20/2021 CLINICAL DATA:  Acute abdominal pain for 5 days. EXAM: CT ABDOMEN AND PELVIS WITHOUT CONTRAST TECHNIQUE: Multidetector CT imaging of the abdomen and pelvis was performed  following the standard protocol without IV contrast. COMPARISON:  CT abdomen pelvis dated 02/04/2015. FINDINGS: Lower chest: No acute abnormality. Hepatobiliary: No focal liver abnormality is seen. No gallstones, gallbladder wall thickening, or biliary dilatation. Pancreas: Unremarkable. No pancreatic ductal dilatation or surrounding inflammatory changes. Spleen: Normal in size without focal abnormality. Adrenals/Urinary Tract: Adrenal  glands are unremarkable. The right kidney appears slightly swollen and demonstrates moderate surrounding fat stranding and perinephric edema. No renal calculi or hydronephrosis is identified on the right. No evidence of abscess formation. The left kidney appears normal, without renal calculi, focal lesion, or hydronephrosis. Bladder is unremarkable. Stomach/Bowel: Stomach is within normal limits. Appendix appears normal. No evidence of bowel wall thickening, distention, or inflammatory changes. Vascular/Lymphatic: No significant vascular findings are present. No enlarged abdominal or pelvic lymph nodes. Reproductive: Uterus and bilateral adnexa are unremarkable. Other: No abdominal wall hernia or abnormality. No abdominopelvic ascites. Musculoskeletal: No acute or significant osseous findings. IMPRESSION: Enlargement of the right kidney with associated fat stranding is nonspecific but may represent acute pyelonephritis. Electronically Signed   By: Romona Curls M.D.   On: 06/20/2021 16:00     Subjective: No acute issues or events overnight denies nausea vomiting diarrhea constipation headache fevers chills or chest pain   Discharge Exam: Vitals:   06/23/21 2240 06/24/21 0524  BP: (!) 104/56 110/64  Pulse: 69 65  Resp: 16 16  Temp: 99.5 F (37.5 C) 99.4 F (37.4 C)  SpO2: 96% 95%   Vitals:   06/23/21 0518 06/23/21 1421 06/23/21 2240 06/24/21 0524  BP:  118/76 (!) 104/56 110/64  Pulse: 75 74 69 65  Resp:  Temp:  99.9 F (37.7 C) 99.5 F (37.5 C) 99.4 F (37.4 C)  TempSrc:  Oral Oral Oral  SpO2: 98% 98% 96% 95%  Weight:      Height:        General: Pt is alert, awake, not in acute distress Cardiovascular: RRR, S1/S2 +, no rubs, no gallops Respiratory: CTA bilaterally, no wheezing, no rhonchi Abdominal: Soft, NT, ND, bowel sounds + Extremities: no edema, no cyanosis    The results of significant diagnostics from this hospitalization (including imaging, microbiology,  ancillary and laboratory) are listed below for reference.     Microbiology: Recent Results (from the past 240 hour(s))  Urine Culture     Status: Abnormal   Collection Time: 06/20/21  1:44 PM   Specimen: Urine, Clean Catch  Result Value Ref Range Status   Specimen Description   Final    URINE, CLEAN CATCH Performed at [facility], [address] [place], [place] [address]    Special Requests   Final    NONE Performed at [facility], [address] [place], [place] [address]    Culture >=100,000 COLONIES/mL ESCHERICHIA COLI (A)  Final   Report Status 06/22/2021 FINAL  Final   Organism ID, Bacteria ESCHERICHIA COLI (A)  Final      Susceptibility   Escherichia coli - MIC*    AMPICILLIN <=2 SENSITIVE Sensitive     CEFAZOLIN <=4 SENSITIVE Sensitive     CEFEPIME <=0.12 SENSITIVE Sensitive     CEFTRIAXONE <=0.25 SENSITIVE Sensitive     CIPROFLOXACIN <=0.25 SENSITIVE Sensitive     GENTAMICIN <=1 SENSITIVE Sensitive     IMIPENEM <=0.25 SENSITIVE Sensitive     NITROFURANTOIN <=16 SENSITIVE Sensitive     TRIMETH/SULFA <=20 SENSITIVE Sensitive     AMPICILLIN/SULBACTAM <=2 SENSITIVE Sensitive  PIP/TAZO <=4 SENSITIVE Sensitive     * >=100,000 COLONIES/mL ESCHERICHIA COLI  Resp Panel by RT-PCR (Flu A&B, Covid) Nasopharyngeal Swab     Status: None   Collection Time: 06/20/21  4:24 PM   Specimen: Nasopharyngeal Swab; Nasopharyngeal(NP) swabs in vial transport medium  Result Value Ref Range Status   SARS Coronavirus 2 by RT PCR NEGATIVE NEGATIVE Final    Comment: (NOTE) SARS-CoV-2 target nucleic acids are NOT DETECTED.  The SARS-CoV-2 RNA is generally detectable in upper respiratory specimens during the acute phase of infection. The lowest concentration of SARS-CoV-2 viral copies this assay can detect is 138 copies/mL. A negative result does not preclude SARS-Cov-2 infection and should not be used as the sole basis for treatment or other patient management  decisions. A negative result may occur with  improper specimen collection/handling, submission of specimen other than nasopharyngeal swab, presence of viral mutation(s) within the areas targeted by this assay, and inadequate number of viral copies(<138 copies/mL). A negative result must be combined with clinical observations, patient history, and epidemiological information. The expected result is Negative.  Fact Sheet for Patients:  BloggerCourse.com  Fact Sheet for Healthcare Providers:  SeriousBroker.it  This test is no t yet approved or cleared by the [place] FDA and  has been authorized for detection and/or diagnosis of SARS-CoV-2 by FDA under an Emergency Use Authorization (EUA). This EUA will remain  in effect (meaning this test can be used) for the duration of the COVID-19 declaration under Section 564(b)(1) of the Act, 21 U.S.C.section 360bbb-3(b)(1), unless the authorization is terminated  or revoked sooner.       Influenza A by PCR NEGATIVE NEGATIVE Final   Influenza B by PCR NEGATIVE NEGATIVE Final    Comment: (NOTE) The Xpert Xpress SARS-CoV-2/FLU/RSV plus assay is intended as an aid in the diagnosis of influenza from Nasopharyngeal swab specimens and should not be used as a sole basis for treatment. Nasal washings and aspirates are unacceptable for Xpert Xpress SARS-CoV-2/FLU/RSV testing.  Fact Sheet for Patients: BloggerCourse.com  Fact Sheet for Healthcare Providers: SeriousBroker.it  This test is not yet approved or cleared by the [place] FDA and has been authorized for detection and/or diagnosis of SARS-CoV-2 by FDA under an Emergency Use Authorization (EUA). This EUA will remain in effect (meaning this test can be used) for the duration of the COVID-19 declaration under Section 564(b)(1) of the Act, 21 U.S.C. section 360bbb-3(b)(1), unless the  authorization is terminated or revoked.  Performed at [facility], [address] [place], [place] [address]   Culture, blood (routine x 2)     Status: None (Preliminary result)   Collection Time: 06/20/21  5:45 PM   Specimen: BLOOD LEFT HAND  Result Value Ref Range Status   Specimen Description   Final    BLOOD LEFT HAND Performed at [facility], [address], [place], [place] [address]    Special Requests   Final    Blood Culture adequate volume BOTTLES DRAWN AEROBIC AND ANAEROBIC Performed at [facility], [address] [place], [place] [address]    Culture   Final    NO GROWTH 3 DAYS Performed at [facility], [address] [place], [place] [address]    Report Status PENDING  Incomplete  Culture, blood (routine x 2)     Status: None (Preliminary result)   Collection Time: 06/20/21  6:00 PM   Specimen: BLOOD LEFT ARM  Result  Value Ref Range Status   Specimen Description   Final    BLOOD LEFT ARM Performed at [facility], [address] [place], [place] [address]    Special Requests   Final    BOTTLES DRAWN AEROBIC AND ANAEROBIC Blood Culture results may not be optimal due to an inadequate volume of blood received in culture bottles Performed at [facility], [address] [place], [place] [address]    Culture   Final    NO GROWTH 3 DAYS Performed at [facility], [address] [place], [place] [address]    Report Status PENDING  Incomplete     Labs: BNP (last 3 results) No results for input(s): BNP in the last 8760 hours. Basic Metabolic Panel: Recent Labs  Lab 06/20/21 1933 06/21/21 0344 06/22/21 0339 06/23/21 0338 06/24/21 0332  NA 138 138 135 135 138  K 3.4* 3.8 3.4* 3.6 3.8  CL 103 103 107 105 108  CO2 26 24 23 24  21*  GLUCOSE 109* 114* 119* 99 132*  BUN 11 10 6 6  5*  CREATININE 1.07* 1.11* 0.91 0.93 0.80  CALCIUM 8.9 8.5* 8.0* 8.2* 8.6*  MG 2.0 2.0  --  2.1  --   PHOS 2.4* 2.6   --   --   --    Liver Function Tests: Recent Labs  Lab 06/20/21 1407 06/21/21 0344  AST 26 20  ALT 38 31  ALKPHOS 90 78  BILITOT 1.4* 1.6*  PROT 8.5* 7.9  ALBUMIN 3.8 3.5   Recent Labs  Lab 06/20/21 1407  LIPASE 24   No results for input(s): AMMONIA in the last 168 hours. CBC: Recent Labs  Lab 06/20/21 1407 06/21/21 0344 06/22/21 0339 06/23/21 0338 06/24/21 0332  WBC 15.2* 11.1* 8.8 8.9 10.1  NEUTROABS 12.7* 8.5*  --   --   --   HGB 12.2 11.2* 9.9* 9.9* 9.9*  HCT 37.7 35.9* 31.2* 31.3* 31.6*  MCV 84.9 86.7 86.2 85.5 85.2  PLT 320 291 290 305 363   Cardiac Enzymes: Recent Labs  Lab 06/20/21 1933  CKTOTAL 31*   BNP: Invalid input(s): POCBNP CBG: No results for input(s): GLUCAP in the last 168 hours. D-Dimer No results for input(s): DDIMER in the last 72 hours. Hgb A1c No results for input(s): HGBA1C in the last 72 hours. Lipid Profile No results for input(s): CHOL, HDL, LDLCALC, TRIG, CHOLHDL, LDLDIRECT in the last 72 hours. Thyroid function studies No results for input(s): TSH, T4TOTAL, T3FREE, THYROIDAB in the last 72 hours.  Invalid input(s): FREET3 Anemia work up No results for input(s): VITAMINB12, FOLATE, FERRITIN, TIBC, IRON, RETICCTPCT in the last 72 hours. Urinalysis    Component Value Date/Time   COLORURINE ORANGE (A) 06/20/2021 1344   APPEARANCEUR TURBID (A) 06/20/2021 1344   LABSPEC  06/20/2021 1344    TEST NOT REPORTED DUE TO COLOR INTERFERENCE OF URINE PIGMENT   PHURINE  06/20/2021 1344    TEST NOT REPORTED DUE TO COLOR INTERFERENCE OF URINE PIGMENT   GLUCOSEU (A) 06/20/2021 1344    TEST NOT REPORTED DUE TO COLOR INTERFERENCE OF URINE PIGMENT   HGBUR (A) 06/20/2021 1344    TEST NOT REPORTED DUE TO COLOR INTERFERENCE OF URINE PIGMENT   BILIRUBINUR (A) 06/20/2021 1344    TEST NOT REPORTED DUE TO COLOR INTERFERENCE OF URINE PIGMENT   KETONESUR (A) 06/20/2021 1344    TEST NOT REPORTED DUE TO COLOR INTERFERENCE OF URINE PIGMENT    PROTEINUR (  A) 06/20/2021 1344    TEST NOT REPORTED DUE TO COLOR INTERFERENCE OF URINE PIGMENT   UROBILINOGEN 1.0 02/04/2015 0930   NITRITE (A) 06/20/2021 1344    TEST NOT REPORTED DUE TO COLOR INTERFERENCE OF URINE PIGMENT   LEUKOCYTESUR (A) 06/20/2021 1344    TEST NOT REPORTED DUE TO COLOR INTERFERENCE OF URINE PIGMENT   Sepsis Labs Invalid input(s): PROCALCITONIN,  WBC,  LACTICIDVEN Microbiology Recent Results (from the past 240 hour(s))  Urine Culture     Status: Abnormal   Collection Time: 06/20/21  1:44 PM   Specimen: Urine, Clean Catch  Result Value Ref Range Status   Specimen Description   Final    URINE, CLEAN CATCH Performed at [facility], [address] [place], [place] [address]    Special Requests   Final    NONE Performed at [facility], [address] [place], [place] [address]    Culture >=100,000 COLONIES/mL ESCHERICHIA COLI (A)  Final   Report Status 06/22/2021 FINAL  Final   Organism ID, Bacteria ESCHERICHIA COLI (A)  Final      Susceptibility   Escherichia coli - MIC*    AMPICILLIN <=2 SENSITIVE Sensitive     CEFAZOLIN <=4 SENSITIVE Sensitive     CEFEPIME <=0.12 SENSITIVE Sensitive     CEFTRIAXONE <=0.25 SENSITIVE Sensitive     CIPROFLOXACIN <=0.25 SENSITIVE Sensitive     GENTAMICIN <=1 SENSITIVE Sensitive     IMIPENEM <=0.25 SENSITIVE Sensitive     NITROFURANTOIN <=16 SENSITIVE Sensitive     TRIMETH/SULFA <=20 SENSITIVE Sensitive     AMPICILLIN/SULBACTAM <=2 SENSITIVE Sensitive     PIP/TAZO <=4 SENSITIVE Sensitive     * >=100,000 COLONIES/mL ESCHERICHIA COLI  Resp Panel by RT-PCR (Flu A&B, Covid) Nasopharyngeal Swab     Status: None   Collection Time: 06/20/21  4:24 PM   Specimen: Nasopharyngeal Swab; Nasopharyngeal(NP) swabs in vial transport medium  Result Value Ref Range Status   SARS Coronavirus 2 by RT PCR NEGATIVE NEGATIVE Final    Comment: (NOTE) SARS-CoV-2 target nucleic acids are NOT DETECTED.  The SARS-CoV-2 RNA  is generally detectable in upper respiratory specimens during the acute phase of infection. The lowest concentration of SARS-CoV-2 viral copies this assay can detect is 138 copies/mL. A negative result does not preclude SARS-Cov-2 infection and should not be used as the sole basis for treatment or other patient management decisions. A negative result may occur with  improper specimen collection/handling, submission of specimen other than nasopharyngeal swab, presence of viral mutation(s) within the areas targeted by this assay, and inadequate number of viral copies(<138 copies/mL). A negative result must be combined with clinical observations, patient history, and epidemiological information. The expected result is Negative.  Fact Sheet for Patients:  BloggerCourse.com  Fact Sheet for Healthcare Providers:  SeriousBroker.it  This test is no t yet approved or cleared by the [place] FDA and  has been authorized for detection and/or diagnosis of SARS-CoV-2 by FDA under an Emergency Use Authorization (EUA). This EUA will remain  in effect (meaning this test can be used) for the duration of the COVID-19 declaration under Section 564(b)(1) of the Act, 21 U.S.C.section 360bbb-3(b)(1), unless the authorization is terminated  or revoked sooner.       Influenza A by PCR NEGATIVE NEGATIVE Final   Influenza B by PCR NEGATIVE NEGATIVE Final    Comment: (NOTE) The Xpert Xpress SARS-CoV-2/FLU/RSV plus assay is intended as an aid in the diagnosis  of influenza from Nasopharyngeal swab specimens and should not be used as a sole basis for treatment. Nasal washings and aspirates are unacceptable for Xpert Xpress SARS-CoV-2/FLU/RSV testing.  Fact Sheet for Patients: BloggerCourse.com  Fact Sheet for Healthcare Providers: SeriousBroker.it  This test is not yet approved or cleared by the  [place] FDA and has been authorized for detection and/or diagnosis of SARS-CoV-2 by FDA under an Emergency Use Authorization (EUA). This EUA will remain in effect (meaning this test can be used) for the duration of the COVID-19 declaration under Section 564(b)(1) of the Act, 21 U.S.C. section 360bbb-3(b)(1), unless the authorization is terminated or revoked.  Performed at [facility], [address] [place], [place] [address]   Culture, blood (routine x 2)     Status: None (Preliminary result)   Collection Time: 06/20/21  5:45 PM   Specimen: BLOOD LEFT HAND  Result Value Ref Range Status   Specimen Description   Final    BLOOD LEFT HAND Performed at [facility], [address], [place], [place] [address]    Special Requests   Final    Blood Culture adequate volume BOTTLES DRAWN AEROBIC AND ANAEROBIC Performed at [facility], [address] [place], [place] [address]    Culture   Final    NO GROWTH 3 DAYS Performed at [facility], [address], [place], [place] [address]    Report Status PENDING  Incomplete  Culture, blood (routine x 2)     Status: None (Preliminary result)   Collection Time: 06/20/21  6:00 PM   Specimen: BLOOD LEFT ARM  Result Value Ref Range Status   Specimen Description   Final    BLOOD LEFT ARM Performed at [facility], [address] [place], [place] [address]    Special Requests   Final    BOTTLES DRAWN AEROBIC AND ANAEROBIC Blood Culture results may not be optimal due to an inadequate volume of blood received in culture bottles Performed at [facility], [address] [place], [place] [address]    Culture   Final    NO GROWTH 3 DAYS Performed at [facility], [address], [place], [place] [address]    Report Status PENDING  Incomplete     Time coordinating discharge: Over 30 minutes  SIGNED:   Azucena Fallen, DO [facility] 06/24/2021, 3:15 PM Pager    If 7PM-7AM, please contact night-coverage [email or web address]

## 2021-06-24 NOTE — Progress Notes (Signed)
Dc package printed and instructions given pt. Patient verbalizes understanding.

## 2021-06-24 NOTE — Plan of Care (Signed)
  Problem: Pain Managment: Goal: General experience of comfort will improve Outcome: Progressing   Problem: Safety: Goal: Ability to remain free from injury will improve Outcome: Progressing   

## 2021-06-26 LAB — CULTURE, BLOOD (ROUTINE X 2)
Culture: NO GROWTH
Culture: NO GROWTH
Special Requests: ADEQUATE

## 2021-09-01 ENCOUNTER — Other Ambulatory Visit: Payer: Self-pay

## 2021-09-01 ENCOUNTER — Encounter (HOSPITAL_BASED_OUTPATIENT_CLINIC_OR_DEPARTMENT_OTHER): Payer: Self-pay

## 2021-09-01 ENCOUNTER — Emergency Department (HOSPITAL_BASED_OUTPATIENT_CLINIC_OR_DEPARTMENT_OTHER)
Admission: EM | Admit: 2021-09-01 | Discharge: 2021-09-01 | Disposition: A | Discharge status: Home / Self Care | Payer: 59 | Attending: Emergency Medicine | Admitting: Emergency Medicine

## 2021-09-01 DIAGNOSIS — Z20822 Contact with and (suspected) exposure to covid-19: Secondary | ICD-10-CM | POA: Insufficient documentation

## 2021-09-01 DIAGNOSIS — J020 Streptococcal pharyngitis: Secondary | ICD-10-CM | POA: Insufficient documentation

## 2021-09-01 LAB — RESP PANEL BY RT-PCR (FLU A&B, COVID) ARPGX2
Influenza A by PCR: NEGATIVE
Influenza B by PCR: NEGATIVE
SARS Coronavirus 2 by RT PCR: NEGATIVE

## 2021-09-01 LAB — GROUP A STREP BY PCR: Group A Strep by PCR: DETECTED — AB

## 2021-09-01 MED ORDER — PENICILLIN V POTASSIUM 500 MG PO TABS: 500.0000 mg | ORAL_TABLET | Freq: Two times a day (BID) | ORAL | 0 refills | Status: AC

## 2021-09-01 MED ORDER — ONDANSETRON HCL 4 MG PO TABS: 4.0000 mg | ORAL_TABLET | Freq: Four times a day (QID) | ORAL | 0 refills | Status: DC

## 2021-09-01 NOTE — Discharge Instructions (Addendum)
Return to ED if any new or worsening symptoms such as shortness of breath, vomiting, lightheadedness, inability to handle your own secretions, drooling You will be contagious until you have been on antibiotics in 24 hours You should treat your sore throat with Tylenol Pick up antibiotics at your local pharmacy a earliest convenience.  I have also written for antinausea medication called Zofran

## 2021-09-01 NOTE — ED Provider Notes (Signed)
[facility] EMERGENCY DEPARTMENT Provider Note   CSN: [id number] Arrival date & time: 09/01/21  1141     History Chief Complaint  Patient presents with   Sore Throat    [name] is a 25 y.o. female who presents to the ED for complaint of sore throat with onset yesterday.  Patient states that the pain is progressively worsened, described as "needles", and states that she has taken NyQuil to relieve her symptoms.  Patient states NyQuil has moderately alleviate her symptoms but symptoms always return.  Patient states that she has felt febrile, had a sore throat, been nauseous and had diarrhea.  Patient denies vomiting, shortness of breath, wheezing, headaches, back pain.   Sore Throat Pertinent negatives include no abdominal pain, no headaches and no shortness of breath.      Past Medical History:  Diagnosis Date   Medical history non-contributory    Migraine     Patient Active Problem List   Diagnosis Date Noted   Pyelonephritis 06/20/2021   Sepsis (HCC) 06/20/2021   AKI (acute kidney injury) (HCC) 06/20/2021   Hypokalemia 06/20/2021   Postpartum care and examination 06/08/2018   History of umbilical hernia repair 02/06/2018    Past Surgical History:  Procedure Laterality Date   HERNIA REPAIR     UMBILICAL HERNIA REPAIR       OB History     Gravida  1   Para  1   Term      Preterm  1   AB      Living  1      SAB      IAB      Ectopic      Multiple  0   Live Births  1           Family History  Problem Relation Age of Onset   Diabetes Mother    Diabetes Father    Cancer Father        stomach   Cancer Other        lymphoma   Diabetes Other     Social History   Tobacco Use   Smoking status: Never   Smokeless tobacco: Never  Vaping Use   Vaping Use: Never used  Substance Use Topics   Alcohol use: Not Currently    Comment: socia;   Drug use: No    Home Medications Prior to Admission medications   Medication  Sig Start Date End Date Taking? Authorizing Provider  ondansetron (ZOFRAN) 4 MG tablet Take 1 tablet (4 mg total) by mouth every 6 (six) hours. 09/01/21  Yes Al Decant, PA-C  penicillin v potassium (VEETID) 500 MG tablet Take 1 tablet (500 mg total) by mouth in the morning and at bedtime for 10 days. 09/01/21 09/11/21 Yes Al Decant, PA-C  acetaminophen (TYLENOL) 500 MG tablet Take 500 mg by mouth every 6 (six) hours as needed for moderate pain.    [provider]  Aspirin-Salicylamide-Caffeine (BC HEADACHE PO) Take 1 packet by mouth daily as needed (pain).    [provider]  Cranberry-Vitamin C (AZO CRANBERRY URINARY TRACT) 250-60 MG CAPS Take 2 capsules by mouth 3 (three) times daily as needed (uti prevention).    [provider]  HYDROcodone-acetaminophen (NORCO/VICODIN) 5-325 MG tablet Take 1-2 tablets by mouth every 4 (four) hours as needed for moderate pain. 06/24/21   Azucena Fallen, MD  Misc. Devices (BREAST PUMP) MISC 1 Device by Other route as needed.  Patient not taking: Reported on 06/20/2021 05/23/18   Brock Bad, MD  norethindrone (MICRONOR,CAMILA,ERRIN) 0.35 MG tablet Take 1 tablet (0.35 mg total) by mouth daily. Patient not taking: Reported on 06/20/2021 06/08/18   Hermina Staggers, MD  ondansetron (ZOFRAN ODT) 8 MG disintegrating tablet Take 1 tablet (8 mg total) by mouth every 8 (eight) hours as needed for nausea or vomiting. Patient not taking: Reported on 06/20/2021 03/08/19   Lorre Nick, MD  ondansetron (ZOFRAN-ODT) 4 MG disintegrating tablet Take 1 tablet (4 mg total) by mouth every 8 (eight) hours as needed for nausea or vomiting. Patient not taking: Reported on 06/20/2021 12/06/18   Mardella Layman, MD  prochlorperazine (COMPAZINE) 10 MG tablet Take 1 tablet (10 mg total) by mouth 2 (two) times daily as needed for nausea or vomiting (or headache). Patient not taking: Reported on 06/20/2021 05/07/21   Alvira Monday, MD     Allergies    Ibuprofen and Other  Review of Systems   Review of Systems  Constitutional:  Positive for fever.  HENT:  Positive for sore throat.   Respiratory:  Negative for shortness of breath and wheezing.   Gastrointestinal:  Positive for diarrhea and nausea. Negative for abdominal pain and vomiting.  Neurological:  Negative for headaches.  All other systems reviewed and are negative.  Physical Exam Updated Vital Signs BP 117/75 (BP Location: Left Arm)    Pulse 98    Temp 99.9 F (37.7 C) (Oral)    Resp 16    Ht 5\' 8"  (1.727 m)    Wt 119.7 kg    SpO2 99%    BMI 40.14 kg/m   Physical Exam Vitals and nursing note reviewed.  Constitutional:      General: She is not in acute distress.    Appearance: She is ill-appearing. She is not toxic-appearing.  HENT:     Mouth/Throat:     Mouth: Mucous membranes are moist.     Pharynx: Uvula midline. Oropharyngeal exudate and posterior oropharyngeal erythema present. No uvula swelling.     Tonsils: Tonsillar exudate present. No tonsillar abscesses. 2+ on the right. 2+ on the left.  Cardiovascular:     Rate and Rhythm: Normal rate and regular rhythm.  Pulmonary:     Effort: Pulmonary effort is normal.     Breath sounds: Normal breath sounds. No wheezing.  Abdominal:     General: Bowel sounds are normal.     Palpations: Abdomen is soft.     Tenderness: There is no abdominal tenderness.  Musculoskeletal:     Cervical back: Normal range of motion.  Skin:    General: Skin is warm and dry.     Capillary Refill: Capillary refill takes less than 2 seconds.  Neurological:     General: No focal deficit present.     Mental Status: She is alert.    ED Results / Procedures / Treatments   Labs (all labs ordered are listed, but only abnormal results are displayed) Labs Reviewed  GROUP A STREP BY PCR - Abnormal; Notable for the following components:      Result Value   Group A Strep by PCR DETECTED (*)    All other components within  normal limits  RESP PANEL BY RT-PCR (FLU A&B, COVID) ARPGX2    EKG None  Radiology No results found.  Procedures Procedures   Medications Ordered in ED Medications - No data to display  ED Course  I have reviewed the triage vital signs and  the nursing notes.  Pertinent labs & imaging results that were available during my care of the patient were reviewed by me and considered in my medical decision making (see chart for details).    MDM Rules/Calculators/A&P                          25 year old female presents for sore throat, nausea, diarrhea, fever since yesterday.  On examination, patient is nonhypoxic, afebrile, clear lung sounds bilaterally, speaking in full sentences, not drooling, phonating appropriately.  Patient has erythematous tonsils bilaterally with exudates on the right side.  Past respiratory panel has resulted and negative for flu and COVID.  Patient strep test is a positive result.  Most likely cause of patient's symptoms at this time is strep throat.  I have instructed this patient to treat her strep throat with Tylenol since she is allergic to ibuprofen.  I written this patient a prescription for Zofran.  I have also written this patient prescription for penicillin 500 mg twice daily for 10 days.  I explained to the patient where she to develop any increase shortness of breath, inability to handle secretions, drooling, hot potato voice that she needs to return to ER immediately.  Patient expresses understanding and agreement with this plan.  Patient expresses agreement with plan for discharge at this time.  I discussed this patient and her case with Dr. Stevie Kern who was also in agreement with the plan for discharge at this time.  Patient stable on discharge.  Final Clinical Impression(s) / ED Diagnoses Final diagnoses:  Strep throat    Rx / DC Orders ED Discharge Orders          Ordered    ondansetron (ZOFRAN) 4 MG tablet  Every 6 hours        09/01/21 1328     penicillin v potassium (VEETID) 500 MG tablet  2 times daily        09/01/21 1328             Al Decant, [place] 09/01/21 1335    Milagros Loll, MD 09/02/21 [phone]

## 2021-09-01 NOTE — ED Triage Notes (Signed)
Took prescription pain med about 10am  Was old rx  from previous illness--unsure what it was that she took.  Also took Nyquil at 9am   Only symptom is severe sore throat Denies cough, body aches, n/v/d

## 2022-11-29 ENCOUNTER — Emergency Department (HOSPITAL_BASED_OUTPATIENT_CLINIC_OR_DEPARTMENT_OTHER): Payer: BC Managed Care – PPO

## 2022-11-29 ENCOUNTER — Emergency Department (HOSPITAL_BASED_OUTPATIENT_CLINIC_OR_DEPARTMENT_OTHER)
Admission: EM | Admit: 2022-11-29 | Discharge: 2022-11-30 | Disposition: A | Discharge status: Home / Self Care | Payer: BC Managed Care – PPO | Attending: Emergency Medicine | Admitting: Emergency Medicine

## 2022-11-29 ENCOUNTER — Encounter (HOSPITAL_BASED_OUTPATIENT_CLINIC_OR_DEPARTMENT_OTHER): Payer: Self-pay

## 2022-11-29 DIAGNOSIS — R1110 Vomiting, unspecified: Secondary | ICD-10-CM | POA: Diagnosis not present

## 2022-11-29 DIAGNOSIS — N12 Tubulo-interstitial nephritis, not specified as acute or chronic: Secondary | ICD-10-CM | POA: Insufficient documentation

## 2022-11-29 DIAGNOSIS — Z7982 Long term (current) use of aspirin: Secondary | ICD-10-CM | POA: Diagnosis not present

## 2022-11-29 DIAGNOSIS — Z1152 Encounter for screening for COVID-19: Secondary | ICD-10-CM | POA: Insufficient documentation

## 2022-11-29 DIAGNOSIS — K529 Noninfective gastroenteritis and colitis, unspecified: Secondary | ICD-10-CM | POA: Diagnosis not present

## 2022-11-29 DIAGNOSIS — R112 Nausea with vomiting, unspecified: Secondary | ICD-10-CM | POA: Diagnosis not present

## 2022-11-29 DIAGNOSIS — R109 Unspecified abdominal pain: Secondary | ICD-10-CM | POA: Diagnosis not present

## 2022-11-29 LAB — URINALYSIS, MICROSCOPIC (REFLEX)

## 2022-11-29 LAB — COMPREHENSIVE METABOLIC PANEL
ALT: 17 U/L (ref 0–44)
AST: 25 U/L (ref 15–41)
Albumin: 3.8 g/dL (ref 3.5–5.0)
Alkaline Phosphatase: 63 U/L (ref 38–126)
Anion gap: 8 (ref 5–15)
BUN: 12 mg/dL (ref 6–20)
CO2: 21 mmol/L — ABNORMAL LOW (ref 22–32)
Calcium: 8.3 mg/dL — ABNORMAL LOW (ref 8.9–10.3)
Chloride: 102 mmol/L (ref 98–111)
Creatinine, Ser: 0.86 mg/dL (ref 0.44–1.00)
GFR, Estimated: >60 mL/min (ref >60)
Glucose, Bld: 112 mg/dL — ABNORMAL HIGH (ref 70–99)
Potassium: 3.3 mmol/L — ABNORMAL LOW (ref 3.5–5.1)
Sodium: 131 mmol/L — ABNORMAL LOW (ref 135–145)
Total Bilirubin: 0.7 mg/dL (ref 0.3–1.2)
Total Protein: 8 g/dL (ref 6.5–8.1)

## 2022-11-29 LAB — CBC
HCT: 38.3 % (ref 36.0–46.0)
Hemoglobin: 12.3 g/dL (ref 12.0–15.0)
MCH: 26 pg (ref 26.0–34.0)
MCHC: 32.1 g/dL (ref 30.0–36.0)
MCV: 81 fL (ref 80.0–100.0)
Platelets: 372 10*3/uL (ref 150–400)
RBC: 4.73 MIL/uL (ref 3.87–5.11)
RDW: 15.3 % (ref 11.5–15.5)
WBC: 7.8 10*3/uL (ref 4.0–10.5)
nRBC: 0 % (ref 0.0–0.2)

## 2022-11-29 LAB — PREGNANCY, URINE: Preg Test, Ur: NEGATIVE

## 2022-11-29 LAB — URINALYSIS, ROUTINE W REFLEX MICROSCOPIC
Bilirubin Urine: NEGATIVE
Glucose, UA: NEGATIVE mg/dL
Ketones, ur: NEGATIVE mg/dL
Leukocytes,Ua: NEGATIVE
Nitrite: NEGATIVE
Protein, ur: 30 mg/dL — AB
Specific Gravity, Urine: 1.025 (ref 1.005–1.030)
pH: 6 (ref 5.0–8.0)

## 2022-11-29 LAB — LACTIC ACID, PLASMA: Lactic Acid, Venous: 2 mmol/L (ref 0.5–1.9)

## 2022-11-29 LAB — RESP PANEL BY RT-PCR (RSV, FLU A&B, COVID)  RVPGX2
Influenza A by PCR: NEGATIVE
Influenza B by PCR: NEGATIVE
Resp Syncytial Virus by PCR: NEGATIVE
SARS Coronavirus 2 by RT PCR: NEGATIVE

## 2022-11-29 LAB — LIPASE, BLOOD: Lipase: 27 U/L (ref 11–51)

## 2022-11-29 MED ORDER — IBUPROFEN 800 MG PO TABS
800.0000 mg | ORAL_TABLET | Freq: Once | ORAL | Status: AC
  Administered 2022-11-29: 800 mg via ORAL
  Filled 2022-11-29: qty 1

## 2022-11-29 MED ORDER — ONDANSETRON HCL 4 MG PO TABS: 4.0000 mg | ORAL_TABLET | Freq: Three times a day (TID) | ORAL | 0 refills | Status: AC | PRN

## 2022-11-29 MED ORDER — ACETAMINOPHEN 325 MG PO TABS
650.0000 mg | ORAL_TABLET | Freq: Once | ORAL | Status: AC
  Administered 2022-11-29: 650 mg via ORAL
  Filled 2022-11-29: qty 2

## 2022-11-29 MED ORDER — SODIUM CHLORIDE 0.9 % IV SOLN
1.0000 g | Freq: Once | INTRAVENOUS | Status: AC
  Administered 2022-11-29: 1 g via INTRAVENOUS
  Filled 2022-11-29: qty 10

## 2022-11-29 MED ORDER — ONDANSETRON HCL 4 MG/2ML IJ SOLN
4.0000 mg | Freq: Four times a day (QID) | INTRAMUSCULAR | Status: DC | PRN
  Administered 2022-11-29: 4 mg via INTRAVENOUS
  Filled 2022-11-29: qty 2

## 2022-11-29 MED ORDER — ONDANSETRON HCL 4 MG/2ML IJ SOLN
4.0000 mg | Freq: Once | INTRAMUSCULAR | Status: AC
  Administered 2022-11-29: 4 mg via INTRAVENOUS
  Filled 2022-11-29: qty 2

## 2022-11-29 MED ORDER — IOHEXOL 300 MG/ML  SOLN
125.0000 mL | Freq: Once | INTRAMUSCULAR | Status: AC | PRN
  Administered 2022-11-29: 125 mL via INTRAVENOUS

## 2022-11-29 MED ORDER — CEPHALEXIN 500 MG PO CAPS: 500.0000 mg | ORAL_CAPSULE | Freq: Three times a day (TID) | ORAL | 0 refills | Status: AC

## 2022-11-29 MED ORDER — LACTATED RINGERS IV BOLUS
1000.0000 mL | Freq: Once | INTRAVENOUS | Status: AC
  Administered 2022-11-29: 1000 mL via INTRAVENOUS

## 2022-11-29 NOTE — ED Notes (Signed)
Pt. Reports vomiting x 8 plus in the past 24 hours with no diarrhea.  Pt. Reports lower mid abd. Pain.  No reports of urinary retention and no reports of back pain.    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

## 2022-11-29 NOTE — ED Notes (Signed)
Unsuccessful PIV and blood draw x2.

## 2022-11-29 NOTE — ED Notes (Signed)
Unable to obtain blood x 2 in triage

## 2022-11-29 NOTE — ED Provider Notes (Signed)
[facility] Provider Note   CSN: [id number] Arrival date & time: 11/29/22  1416     History Chief Complaint  Patient presents with   Emesis    [name] is a 27 y.o. female with PMH umbilical hernia repair, previous admission for sepsis secondary to pyelonephritis presenting for about 5 days of emesis.  She said that about 4 nights ago, she started having vomiting and was not able to sleep as she was getting up to throw up multiple times.  Before that, she says her kids were sick.  She then felt okay for a couple days, but this morning after work she started vomiting again every 20 to 30 minutes for about 8 hours.  In between these 2 intervals, she was taking in good p.o. intake.  She has some epigastric belly pain.  No diarrhea.  No chest pain, dyspnea.  Some subjective fevers and chills at home.  No dysuria, hematuria, changes in urinary frequency.  She does have some suprapubic abdominal pain.    Home Medications Prior to Admission medications   Medication Sig Start Date End Date Taking? Authorizing Provider  cephALEXin (KEFLEX) 500 MG capsule Take 1 capsule (500 mg total) by mouth 3 (three) times daily for 7 days. 11/29/22 12/06/22 Yes Linus Galas, MD  ondansetron (ZOFRAN) 4 MG tablet Take 1 tablet (4 mg total) by mouth every 8 (eight) hours as needed for nausea or vomiting. 11/29/22 11/29/23 Yes Linus Galas, MD  acetaminophen (TYLENOL) 500 MG tablet Take 500 mg by mouth every 6 (six) hours as needed for moderate pain.    [provider]  Aspirin-Salicylamide-Caffeine (BC HEADACHE PO) Take 1 packet by mouth daily as needed (pain).    [provider]  Cranberry-Vitamin C (AZO CRANBERRY URINARY TRACT) 250-60 MG CAPS Take 2 capsules by mouth 3 (three) times daily as needed (uti prevention).    [provider]  HYDROcodone-acetaminophen (NORCO/VICODIN) 5-325 MG tablet Take 1-2 tablets by mouth  every 4 (four) hours as needed for moderate pain. 06/24/21   Little Ishikawa, MD  Misc. Devices (BREAST PUMP) MISC 1 Device by Other route as needed. Patient not taking: Reported on 06/20/2021 05/23/18   Shelly Bombard, MD  norethindrone (MICRONOR,CAMILA,ERRIN) 0.35 MG tablet Take 1 tablet (0.35 mg total) by mouth daily. Patient not taking: Reported on 06/20/2021 06/08/18   Chancy Milroy, MD  prochlorperazine (COMPAZINE) 10 MG tablet Take 1 tablet (10 mg total) by mouth 2 (two) times daily as needed for nausea or vomiting (or headache). Patient not taking: Reported on 06/20/2021 05/07/21   Gareth Morgan, MD      Allergies    Ibuprofen and Other    Review of Systems   See HPI  Physical Exam Updated Vital Signs BP 124/78 (BP Location: Left Arm)   Pulse 86   Temp (!) 102.2 F (39 C) (Oral)   Resp 18   Ht '5\' 8"'$  (1.727 m)   Wt 131.5 kg   LMP 11/22/2022   SpO2 99%   BMI 44.09 kg/m  Physical Exam Constitutional:      General: She is not in acute distress.    Appearance: She is ill-appearing.  Cardiovascular:     Rate and Rhythm: Normal rate and regular rhythm.     Heart sounds: Normal heart sounds.  Pulmonary:     Effort: Pulmonary effort is normal. No respiratory distress.     Breath sounds: Normal breath sounds.  Abdominal:  General: Bowel sounds are normal. There is no distension.     Palpations: Abdomen is soft.     Tenderness: There is abdominal tenderness (Epigastric and suprapubic tenderness). There is left CVA tenderness. There is no right CVA tenderness, guarding or rebound.  Musculoskeletal:     Right lower leg: No edema.     Left lower leg: No edema.  Skin:    General: Skin is warm and dry.  Neurological:     Mental Status: She is alert.     ED Results / Procedures / Treatments   Labs (all labs ordered are listed, but only abnormal results are displayed) Labs Reviewed  COMPREHENSIVE METABOLIC PANEL - Abnormal; Notable for the following components:       Result Value   Sodium 131 (*)    Potassium 3.3 (*)    CO2 21 (*)    Glucose, Bld 112 (*)    Calcium 8.3 (*)    All other components within normal limits  URINALYSIS, ROUTINE W REFLEX MICROSCOPIC - Abnormal; Notable for the following components:   Hgb urine dipstick TRACE (*)    Protein, ur 30 (*)    All other components within normal limits  URINALYSIS, MICROSCOPIC (REFLEX) - Abnormal; Notable for the following components:   Bacteria, UA MANY (*)    All other components within normal limits  LACTIC ACID, PLASMA - Abnormal; Notable for the following components:   Lactic Acid, Venous 2.0 (*)    All other components within normal limits  RESP PANEL BY RT-PCR (RSV, FLU A&B, COVID)  RVPGX2  CULTURE, BLOOD (ROUTINE X 2)  LIPASE, BLOOD  CBC  PREGNANCY, URINE    EKG None  Radiology CT ABDOMEN PELVIS W CONTRAST  Result Date: 11/29/2022 CLINICAL DATA:  27 year old female with vomiting and weakness for 1 week. Mid abdominal pain. Umbilical hernia repair as a child. EXAM: CT ABDOMEN AND PELVIS WITH CONTRAST TECHNIQUE: Multidetector CT imaging of the abdomen and pelvis was performed using the standard protocol following bolus administration of intravenous contrast. RADIATION DOSE REDUCTION: This exam was performed according to the departmental dose-optimization program which includes automated exposure control, adjustment of the mA and/or kV according to patient size and/or use of iterative reconstruction technique. CONTRAST:  159m OMNIPAQUE IOHEXOL 300 MG/ML  SOLN COMPARISON:  CT abdomen and pelvis 06/20/2021 FINDINGS: Lower chest: No acute abnormality. Hepatobiliary: Unremarkable liver, gallbladder, and biliary tree. Pancreas: Unremarkable. Spleen: Unremarkable. Adrenals/Urinary Tract: Normal adrenal glands. No urinary calculi or hydronephrosis. Unremarkable bladder. Stomach/Bowel: Normal caliber large and small bowel. No bowel wall thickening. Unremarkable stomach. Normal appendix.  Vascular/Lymphatic: No significant vascular findings are present. No enlarged abdominal or pelvic lymph nodes. Reproductive: Uterus and bilateral adnexa are unremarkable. Other: No free intraperitoneal fluid or air. Postoperative change of umbilical hernia repair. Musculoskeletal: No acute or significant osseous findings. IMPRESSION: 1. No acute abdominopelvic abnormality. Electronically Signed   By: TPlacido SouM.D.   On: 11/29/2022 22:05    Procedures   Medications Ordered in ED Medications  ondansetron (ZOFRAN) injection 4 mg (4 mg Intravenous Given 11/29/22 2020)  ibuprofen (ADVIL) tablet 800 mg (has no administration in time range)  ondansetron (ZOFRAN) injection 4 mg (has no administration in time range)  lactated ringers bolus 1,000 mL (0 mLs Intravenous Stopped 11/29/22 2125)  cefTRIAXone (ROCEPHIN) 1 g in sodium chloride 0.9 % 100 mL IVPB (0 g Intravenous Stopped 11/29/22 2125)  acetaminophen (TYLENOL) tablet 650 mg (650 mg Oral Given 11/29/22 2157)  iohexol (OMNIPAQUE) 300 MG/ML  solution 125 mL (125 mLs Intravenous Contrast Given 11/29/22 2136)    ED Course/ Medical Decision Making/ A&P 1}                          Medical Decision Making Amount and/or Complexity of Data Reviewed Labs: ordered. Radiology: ordered.  Risk OTC drugs. Prescription drug management.   27 year old female with past medical history of local hernia repair, prior hospital admission for sepsis secondary to pyelonephritis presenting with few days of emesis and abdominal pain.  She is febrile, hemodynamically stable, oxygenating well on room air on presentation.  Has epigastric and suprapubic tenderness as well as left CVA tenderness on exam.  Appears volume down.  RVP was negative.  UA shows trace Hgb, protein, and many bacteria.  Lipase was negative.  Sodium 131, K 3.3, no AKI, LFTs normal on CMP.  No leukocytosis.  Initial lactic 2.  Opted to obtain CT abdomen pelvis with contrast with her CVA tenderness  and history of pyelonephritis which did not show any acute abnormalities.  She likely has viral gastroenteritis, but with her history of sepsis with pyelonephritis and some UA changes and CVA tenderness on exam, will opt to go ahead and treat with a course of antibiotics as well.  Fever resolved with Tylenol and ibuprofen.  Will discharge her with 7-day course of Keflex and some nausea medicine and strict return precautions and PCP follow-up.   Final Clinical Impression(s) / ED Diagnoses Final diagnoses:  Gastroenteritis  Pyelonephritis    Rx / DC Orders ED Discharge Orders          Ordered    cephALEXin (KEFLEX) 500 MG capsule  3 times daily        11/29/22 2338    ondansetron (ZOFRAN) 4 MG tablet  Every 8 hours PRN        11/29/22 2338              Linus Galas, MD 11/29/22 2340    Linus Galas, MD 11/30/22 1216    Drenda Freeze, MD 11/30/22 [phone]

## 2022-11-29 NOTE — ED Triage Notes (Addendum)
Pt presents with complaints of vomiting and weakness x 1 week. Vomiting multiple times daily  Pt reports mid abdominal pain and headache. Denies diarrhea.

## 2022-11-29 NOTE — Discharge Instructions (Addendum)
Please take your Keflex 500 mg 3 times a day for the next week to complete a course for possible kidney infection.  Can use Zofran 4 mg every 8 hours as needed for nausea.  Follow-up with your primary doctor as needed.  If you start having worse vomiting, fevers and chills, please come back to the ED to be reevaluated.

## 2022-12-05 LAB — CULTURE, BLOOD (ROUTINE X 2)
Culture: NO GROWTH
Special Requests: ADEQUATE

## 2023-01-03 DIAGNOSIS — L309 Dermatitis, unspecified: Secondary | ICD-10-CM | POA: Diagnosis not present

## 2023-01-03 DIAGNOSIS — Z6841 Body Mass Index (BMI) 40.0 and over, adult: Secondary | ICD-10-CM | POA: Diagnosis not present

## 2023-01-03 DIAGNOSIS — G43119 Migraine with aura, intractable, without status migrainosus: Secondary | ICD-10-CM | POA: Diagnosis not present

## 2023-03-23 DIAGNOSIS — F331 Major depressive disorder, recurrent, moderate: Secondary | ICD-10-CM | POA: Diagnosis not present

## 2023-03-23 DIAGNOSIS — F411 Generalized anxiety disorder: Secondary | ICD-10-CM | POA: Diagnosis not present

## 2023-05-06 DIAGNOSIS — R3989 Other symptoms and signs involving the genitourinary system: Secondary | ICD-10-CM | POA: Diagnosis not present

## 2023-06-09 ENCOUNTER — Other Ambulatory Visit (HOSPITAL_BASED_OUTPATIENT_CLINIC_OR_DEPARTMENT_OTHER): Payer: Self-pay

## 2023-06-09 MED ORDER — ZEPBOUND 2.5 MG/0.5ML ~~LOC~~ SOAJ
2.5000 mg | SUBCUTANEOUS | 0 refills | Status: AC
  Filled 2023-06-09 (×2): qty 2, 28d supply, fill #0

## 2023-07-14 ENCOUNTER — Other Ambulatory Visit (HOSPITAL_BASED_OUTPATIENT_CLINIC_OR_DEPARTMENT_OTHER): Payer: Self-pay

## 2023-07-14 MED ORDER — ZEPBOUND 5 MG/0.5ML ~~LOC~~ SOAJ
5.0000 mg | SUBCUTANEOUS | 0 refills | Status: AC
  Filled 2023-07-14 (×2): qty 2, 28d supply, fill #0

## 2023-08-14 DIAGNOSIS — B3731 Acute candidiasis of vulva and vagina: Secondary | ICD-10-CM | POA: Diagnosis not present

## 2023-08-22 ENCOUNTER — Telehealth: Payer: Self-pay

## 2023-08-22 ENCOUNTER — Ambulatory Visit
Admission: EM | Admit: 2023-08-22 | Discharge: 2023-08-22 | Disposition: A | Discharge status: Home / Self Care | Payer: BC Managed Care – PPO | Attending: Family Medicine | Admitting: Family Medicine

## 2023-08-22 DIAGNOSIS — N3001 Acute cystitis with hematuria: Secondary | ICD-10-CM | POA: Insufficient documentation

## 2023-08-22 DIAGNOSIS — R102 Pelvic and perineal pain: Secondary | ICD-10-CM | POA: Insufficient documentation

## 2023-08-22 DIAGNOSIS — N760 Acute vaginitis: Secondary | ICD-10-CM | POA: Insufficient documentation

## 2023-08-22 DIAGNOSIS — N898 Other specified noninflammatory disorders of vagina: Secondary | ICD-10-CM | POA: Insufficient documentation

## 2023-08-22 LAB — POCT URINALYSIS DIP (MANUAL ENTRY)
Glucose, UA: 100 mg/dL — AB
Ketones, POC UA: NEGATIVE mg/dL
Nitrite, UA: NEGATIVE
Protein Ur, POC: 100 mg/dL — AB
Spec Grav, UA: >=1.03 — AB (ref 1.010–1.025)
Urobilinogen, UA: 2 U/dL — AB
pH, UA: 6 (ref 5.0–8.0)

## 2023-08-22 LAB — POCT URINE PREGNANCY: Preg Test, Ur: NEGATIVE

## 2023-08-22 MED ORDER — CEPHALEXIN 500 MG PO CAPS: 500.0000 mg | ORAL_CAPSULE | Freq: Three times a day (TID) | ORAL | 0 refills | Status: AC

## 2023-08-22 MED ORDER — FLUCONAZOLE 150 MG PO TABS: 150.0000 mg | ORAL_TABLET | ORAL | 0 refills | Status: DC

## 2023-08-22 MED ORDER — FLUCONAZOLE 150 MG PO TABS: 150.0000 mg | ORAL_TABLET | ORAL | 0 refills | Status: AC

## 2023-08-22 NOTE — ED Triage Notes (Signed)
Pt c/o left side abd pain, vaginal itching started 11/20-states she was seen at [facility] 11/23 and treated for yeast infection-states she did have some relief but a return x 3 days-NAD-steady gait

## 2023-08-22 NOTE — Discharge Instructions (Addendum)
Please start cephalexin to address an urinary tract infection. This is for 7 days, taken 3 times daily. Start fluconazole one tablet every 3 days for recurrent yeast infection.  Make sure you hydrate very well with plain water and a quantity of 80 ounces of water a day.  Please limit drinks that are considered urinary irritants such as soda, sweet tea, coffee, energy drinks, alcohol.  These can worsen your urinary and genital symptoms but also be the source of them.  I will let you know about your urine culture and vaginal swab results through MyChart to see if we need to prescribe or change your antibiotics based off of those results.

## 2023-08-22 NOTE — ED Provider Notes (Incomplete)
[facility]  Note:  This document was prepared using Conservation officer, historic buildings and may include unintentional dictation errors.  MRN: [id number] DOB: 02-14-1996  Subjective:   [name] is a 27 y.o. female presenting for 3-day history of recurrent vaginal itching, irritation and left-sided pelvic discomfort.  Patient has significant concern for recurrent yeast infection.  She also has a history of urinary tract infections, kidney infections and would like to be checked for this.  Chart review shows that she did have pyelonephritis, led to an AKI and sepsis.  Additionally she did have dysuria but has been intermittent since and not consistent.  Patient tries to hydrate well.  Drinks 3-4 bottles of water daily.  No fever, nausea, vomiting, abdominal pain, vaginal discharge.  No current facility-administered medications for this encounter.  Current Outpatient Medications:    acetaminophen (TYLENOL) 500 MG tablet, Take 500 mg by mouth every 6 (six) hours as needed for moderate pain., Disp: , Rfl:    Aspirin-Salicylamide-Caffeine (BC HEADACHE PO), Take 1 packet by mouth daily as needed (pain)., Disp: , Rfl:    Cranberry-Vitamin C (AZO CRANBERRY URINARY TRACT) 250-60 MG CAPS, Take 2 capsules by mouth 3 (three) times daily as needed (uti prevention)., Disp: , Rfl:    HYDROcodone-acetaminophen (NORCO/VICODIN) 5-325 MG tablet, Take 1-2 tablets by mouth every 4 (four) hours as needed for moderate pain., Disp: 30 tablet, Rfl: 0   Misc. Devices (BREAST PUMP) MISC, 1 Device by Other route as needed. (Patient not taking: Reported on 06/20/2021), Disp: 1 each, Rfl: 0   norethindrone (MICRONOR,CAMILA,ERRIN) 0.35 MG tablet, Take 1 tablet (0.35 mg total) by mouth daily. (Patient not taking: Reported on 06/20/2021), Disp: 1 Package, Rfl: 11   ondansetron (ZOFRAN) 4 MG tablet, Take 1 tablet (4 mg total) by mouth every 8 (eight) hours as needed for nausea or vomiting., Disp: 30  tablet, Rfl: 0   prochlorperazine (COMPAZINE) 10 MG tablet, Take 1 tablet (10 mg total) by mouth 2 (two) times daily as needed for nausea or vomiting (or headache). (Patient not taking: Reported on 06/20/2021), Disp: 10 tablet, Rfl: 0   tirzepatide (ZEPBOUND) 2.5 MG/0.5ML Pen, Inject 2.5 mg subcutaneously once weekly for four weeks, Disp: 2 mL, Rfl: 0   tirzepatide (ZEPBOUND) 5 MG/0.5ML Pen, Inject 5 mg into the skin once a week., Disp: 2 mL, Rfl: 0   Allergies  Allergen Reactions   Ibuprofen Nausea Only   Other     MSG, processed foods    Past Medical History:  Diagnosis Date   Medical history non-contributory    Migraine      Past Surgical History:  Procedure Laterality Date   HERNIA REPAIR     UMBILICAL HERNIA REPAIR      Family History  Problem Relation Age of Onset   Diabetes Mother    Diabetes Father    Cancer Father        stomach   Cancer Other        lymphoma   Diabetes Other     Social History   Tobacco Use   Smoking status: Never   Smokeless tobacco: Never  Vaping Use   Vaping status: Never Used  Substance Use Topics   Alcohol use: Not Currently   Drug use: No    ROS   Objective:   Vitals: BP 116/76 (BP Location: Right Arm)   Pulse 80   Temp 98 F (36.7 C) (Oral)   Resp 20   LMP  08/19/2023   SpO2 97%   Physical Exam Constitutional:      General: She is not in acute distress.    Appearance: Normal appearance. She is well-developed. She is not ill-appearing, toxic-appearing or diaphoretic.  HENT:     Head: Normocephalic and atraumatic.     Nose: Nose normal.     Mouth/Throat:     Mouth: Mucous membranes are moist.     Pharynx: Oropharynx is clear.  Eyes:     General: No scleral icterus.       Right eye: No discharge.        Left eye: No discharge.     Extraocular Movements: Extraocular movements intact.     Conjunctiva/sclera: Conjunctivae normal.  Cardiovascular:     Rate and Rhythm: Normal rate.  Pulmonary:     Effort:  Pulmonary effort is normal.  Abdominal:     General: Bowel sounds are normal. There is no distension.     Palpations: Abdomen is soft. There is no mass.     Tenderness: There is abdominal tenderness (mild left sided pelvic pain) in the suprapubic area. There is no right CVA tenderness, left CVA tenderness, guarding or rebound.  Skin:    General: Skin is warm and dry.  Neurological:     General: No focal deficit present.     Mental Status: She is alert and oriented to person, place, and time.  Psychiatric:        Mood and Affect: Mood normal.        Behavior: Behavior normal.        Thought Content: Thought content normal.        Judgment: Judgment normal.     Results for orders placed or performed during the hospital encounter of 08/22/23 (from the past 24 hour(s))  POCT urinalysis dipstick     Status: Abnormal   Collection Time: 08/22/23  7:11 PM  Result Value Ref Range   Color, UA straw (A) yellow   Clarity, UA cloudy (A) clear   Glucose, UA =100 (A) negative mg/dL   Bilirubin, UA moderate (A) negative   Ketones, POC UA negative negative mg/dL   Spec Grav, UA >=5.409 (A) 1.010 - 1.025   Blood, UA large (A) negative   pH, UA 6.0 5.0 - 8.0   Protein Ur, POC =100 (A) negative mg/dL   Urobilinogen, UA 2.0 (A) 0.2 or 1.0 E.U./dL   Nitrite, UA Negative Negative   Leukocytes, UA Trace (A) Negative  POCT urine pregnancy     Status: None   Collection Time: 08/22/23  7:11 PM  Result Value Ref Range   Preg Test, Ur Negative Negative    Assessment and Plan :   PDMP not reviewed this encounter.  1. Acute cystitis with hematuria   2. Suprapubic pain   3. Vaginal itching   4. Acute vaginitis    Start cephalexin to cover for acute cystitis, urine culture and vaginal cytology pending.  Will treat empirically for yeast vaginitis with fluconazole.  Recommended aggressive hydration, limiting urinary irritants.  Low suspicion for pyelonephritis.  Counseled patient on potential for  adverse effects with medications prescribed/recommended today, ER and return-to-clinic precautions discussed, patient verbalized understanding.     Wallis Bamberg, [place] 08/23/23 [phone]

## 2023-08-23 ENCOUNTER — Telehealth: Payer: Self-pay

## 2023-08-23 LAB — URINE CULTURE: Culture: NO GROWTH

## 2023-08-23 MED ORDER — METRONIDAZOLE 500 MG PO TABS: 500.0000 mg | ORAL_TABLET | Freq: Two times a day (BID) | ORAL | 0 refills | Status: AC

## 2023-08-23 NOTE — Telephone Encounter (Signed)
 Per protocol, pt requires tx with metronidazole. Attempted to reach patient x1. LVM. Rx sent to pharmacy on file.

## 2023-08-24 LAB — CERVICOVAGINAL ANCILLARY ONLY
Bacterial Vaginitis (gardnerella): POSITIVE — AB
Candida Glabrata: NEGATIVE
Candida Vaginitis: NEGATIVE
Chlamydia: NEGATIVE
Comment: NEGATIVE
Comment: NEGATIVE
Comment: NEGATIVE
Comment: NEGATIVE
Comment: NEGATIVE
Comment: NORMAL
Neisseria Gonorrhea: NEGATIVE
Trichomonas: POSITIVE — AB

## 2023-08-28 ENCOUNTER — Ambulatory Visit
Admission: EM | Admit: 2023-08-28 | Discharge: 2023-08-28 | Disposition: A | Discharge status: Home / Self Care | Payer: BC Managed Care – PPO | Attending: Family Medicine | Admitting: Family Medicine

## 2023-08-28 DIAGNOSIS — R202 Paresthesia of skin: Secondary | ICD-10-CM

## 2023-08-28 DIAGNOSIS — T50905A Adverse effect of unspecified drugs, medicaments and biological substances, initial encounter: Secondary | ICD-10-CM

## 2023-08-28 DIAGNOSIS — K1379 Other lesions of oral mucosa: Secondary | ICD-10-CM

## 2023-08-28 MED ORDER — PREDNISONE 10 MG PO TABS: 30.0000 mg | ORAL_TABLET | Freq: Every day | ORAL | 0 refills | Status: AC

## 2023-08-28 MED ORDER — HYDROXYZINE HCL 25 MG PO TABS: 12.5000 mg | ORAL_TABLET | Freq: Three times a day (TID) | ORAL | 0 refills | Status: AC | PRN

## 2023-08-28 NOTE — ED Provider Notes (Signed)
[facility]  Note:  This document was prepared using Conservation officer, historic buildings and may include unintentional dictation errors.  MRN: [id number] DOB: 1996-05-07  Subjective:   [name] is a 27 y.o. female presenting for 5-day history of intermittent tingling about the tip of her tongue, facial tingling and pain about the upper lip, slight facial swelling when she wakes up in the mornings.  Has not had any particular overt shortness of breath, wheezing, nausea, vomiting, abdominal pain.  Patient has been on multiple medications recently.  Was initially started on cephalexin for suspected urinary tract infection.  Urine culture was negative and there after was switched to metronidazole for treatment of bacterial vaginosis, trichomoniasis.  Patient was to maintain fluconazole for yeast infection, acute vaginitis.  She also reports that the symptoms also coincide with increasing her dosing of Zepbound.  No current facility-administered medications for this encounter.  Current Outpatient Medications:    acetaminophen (TYLENOL) 500 MG tablet, Take 500 mg by mouth every 6 (six) hours as needed for moderate pain., Disp: , Rfl:    Aspirin-Salicylamide-Caffeine (BC HEADACHE PO), Take 1 packet by mouth daily as needed (pain)., Disp: , Rfl:    cephALEXin (KEFLEX) 500 MG capsule, Take 1 capsule (500 mg total) by mouth 3 (three) times daily., Disp: 21 capsule, Rfl: 0   Cranberry-Vitamin C (AZO CRANBERRY URINARY TRACT) 250-60 MG CAPS, Take 2 capsules by mouth 3 (three) times daily as needed (uti prevention)., Disp: , Rfl:    fluconazole (DIFLUCAN) 150 MG tablet, Take 1 tablet (150 mg total) by mouth every 3 (three) days., Disp: 5 tablet, Rfl: 0   HYDROcodone-acetaminophen (NORCO/VICODIN) 5-325 MG tablet, Take 1-2 tablets by mouth every 4 (four) hours as needed for moderate pain., Disp: 30 tablet, Rfl: 0   metroNIDAZOLE (FLAGYL) 500 MG tablet, Take 1 tablet (500 mg total) by  mouth 2 (two) times daily for 7 days., Disp: 14 tablet, Rfl: 0   Misc. Devices (BREAST PUMP) MISC, 1 Device by Other route as needed. (Patient not taking: Reported on 06/20/2021), Disp: 1 each, Rfl: 0   norethindrone (MICRONOR,CAMILA,ERRIN) 0.35 MG tablet, Take 1 tablet (0.35 mg total) by mouth daily. (Patient not taking: Reported on 06/20/2021), Disp: 1 Package, Rfl: 11   ondansetron (ZOFRAN) 4 MG tablet, Take 1 tablet (4 mg total) by mouth every 8 (eight) hours as needed for nausea or vomiting., Disp: 30 tablet, Rfl: 0   prochlorperazine (COMPAZINE) 10 MG tablet, Take 1 tablet (10 mg total) by mouth 2 (two) times daily as needed for nausea or vomiting (or headache). (Patient not taking: Reported on 06/20/2021), Disp: 10 tablet, Rfl: 0   tirzepatide (ZEPBOUND) 2.5 MG/0.5ML Pen, Inject 2.5 mg subcutaneously once weekly for four weeks, Disp: 2 mL, Rfl: 0   tirzepatide (ZEPBOUND) 5 MG/0.5ML Pen, Inject 5 mg into the skin once a week. (Patient taking differently: Inject 10 mg into the skin once a week.), Disp: 2 mL, Rfl: 0   Allergies  Allergen Reactions   Ibuprofen Nausea Only   Other     MSG, processed foods    Past Medical History:  Diagnosis Date   Medical history non-contributory    Migraine      Past Surgical History:  Procedure Laterality Date   HERNIA REPAIR     UMBILICAL HERNIA REPAIR      Family History  Problem Relation Age of Onset   Diabetes Mother    Diabetes Father    Cancer Father  stomach   Cancer Other        lymphoma   Diabetes Other     Social History   Tobacco Use   Smoking status: Never   Smokeless tobacco: Never  Vaping Use   Vaping status: Never Used  Substance Use Topics   Alcohol use: Not Currently   Drug use: No    ROS   Objective:   Vitals: BP 112/77 (BP Location: Left Arm)   Pulse 83   Temp 98.3 F (36.8 C) (Oral)   Resp 16   LMP 08/24/2023 (Exact Date)   SpO2 97%   Breastfeeding No   Physical Exam Constitutional:       General: She is not in acute distress.    Appearance: Normal appearance. She is well-developed and normal weight. She is not ill-appearing, toxic-appearing or diaphoretic.  HENT:     Head: Normocephalic and atraumatic.     Right Ear: Tympanic membrane, ear canal and external ear normal. No drainage or tenderness. No middle ear effusion. There is no impacted cerumen. Tympanic membrane is not erythematous or bulging.     Left Ear: Tympanic membrane, ear canal and external ear normal. No drainage or tenderness.  No middle ear effusion. There is no impacted cerumen. Tympanic membrane is not erythematous or bulging.     Nose: Nose normal. No congestion or rhinorrhea.     Mouth/Throat:     Mouth: Mucous membranes are moist. No oral lesions.     Pharynx: No pharyngeal swelling, oropharyngeal exudate, posterior oropharyngeal erythema or uvula swelling.     Tonsils: No tonsillar exudate or tonsillar abscesses. 0 on the right. 0 on the left.  Eyes:     General: No scleral icterus.       Right eye: No discharge.        Left eye: No discharge.     Extraocular Movements: Extraocular movements intact.     Right eye: Normal extraocular motion.     Left eye: Normal extraocular motion.     Conjunctiva/sclera: Conjunctivae normal.  Cardiovascular:     Rate and Rhythm: Normal rate and regular rhythm.     Heart sounds: Normal heart sounds. No murmur heard.    No friction rub. No gallop.  Pulmonary:     Effort: Pulmonary effort is normal. No respiratory distress.     Breath sounds: No stridor. No wheezing, rhonchi or rales.  Chest:     Chest wall: No tenderness.  Musculoskeletal:     Cervical back: Normal range of motion and neck supple.  Lymphadenopathy:     Cervical: No cervical adenopathy.  Skin:    General: Skin is warm and dry.     Findings: No rash.  Neurological:     General: No focal deficit present.     Mental Status: She is alert and oriented to person, place, and time.  Psychiatric:         Mood and Affect: Mood normal.        Behavior: Behavior normal.     Assessment and Plan :   PDMP not reviewed this encounter.  1. Medication reaction, initial encounter   2. Facial tingling sensation   3. Oral pain    No signs of anaphylaxis.  Discussed with patient possibility of medication reaction but as she is currently taking multiple medications is difficult to isolate which one.  Recommended an oral prednisone course at 30 mg for 5 days.  Also recommended hydroxyzine.  Maintain and finish the course  of metronidazole and fluconazole.  Revisit the use of Zepbound with her prescribing provider.  Counseled patient on potential for adverse effects with medications prescribed/recommended today, ER and return-to-clinic precautions discussed, patient verbalized understanding.    Wallis Bamberg, [place] 08/28/23 [id number]

## 2023-08-28 NOTE — ED Triage Notes (Signed)
Pt reports tip of the tongue is tingling and numb x 1 week; facial swelling when she wake sup in the mornings x 5 days;  pain in upper lip when eating x 1 day. Pt is not in distress, pt denies difficulty breathing, swallowing, talking. Benadryl gives no relief. Reports she had a "lot going on", one of the things she think is Zepbound, as gives her "some weird side effects" she increased the dose every month, last increase was 2 weeks ago, pt think can be also cephalexin and fluconazole  she had 2 weeks ago.

## 2023-09-28 IMAGING — CT CT ABD-PELV W/O CM
2 of 4 series · 16 of 46 positions shown, 18 images · non-contrast
Comparison: CT abdomen pelvis dated 02/04/2015.

CLINICAL DATA: Acute abdominal pain for 5 days.

EXAM:
CT ABDOMEN AND PELVIS WITHOUT CONTRAST
TECHNIQUE: Multidetector CT imaging of the abdomen and pelvis was performed
following the standard protocol without IV contrast.

[Series 2: axial st · axial · 0.98mm/px · z∈[-447,+13]mm · 13 of 100 slices shown, 15 images]
[im 4/100  soft-tissue]
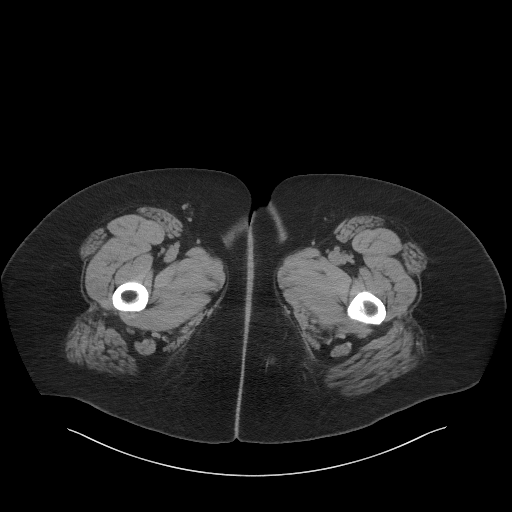
[im 4/100  bone]
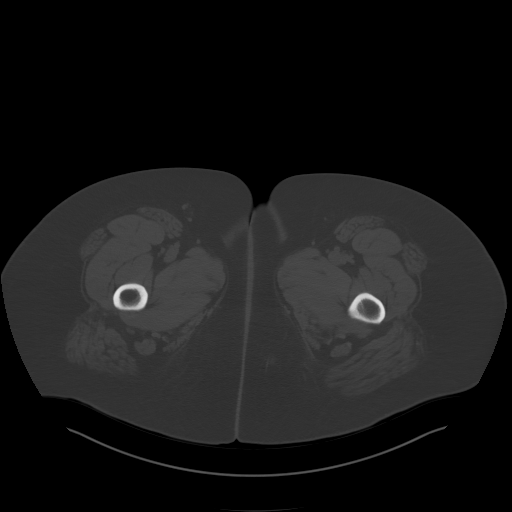
[im 12/100  soft-tissue]
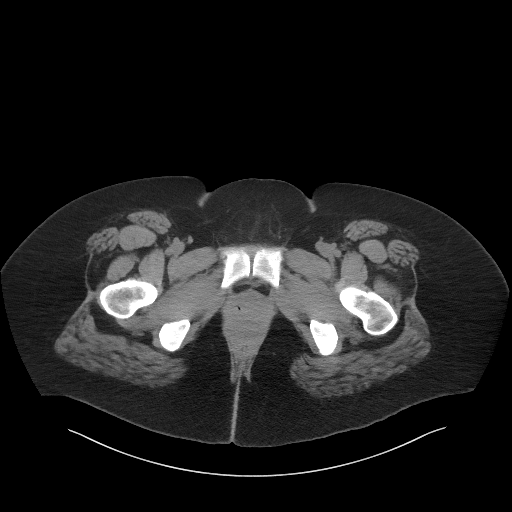
[im 20/100  soft-tissue]
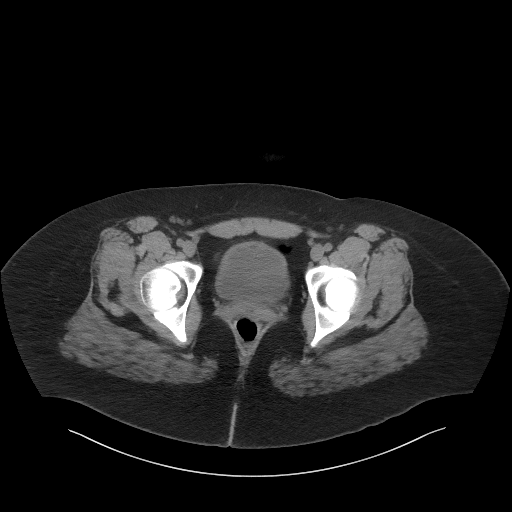
[im 28/100  soft-tissue]
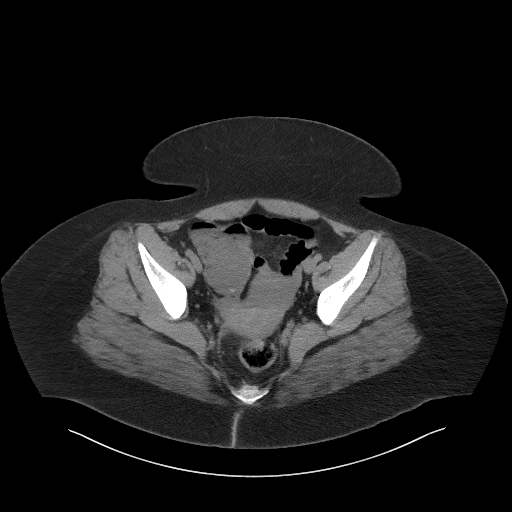
[im 36/100  soft-tissue]
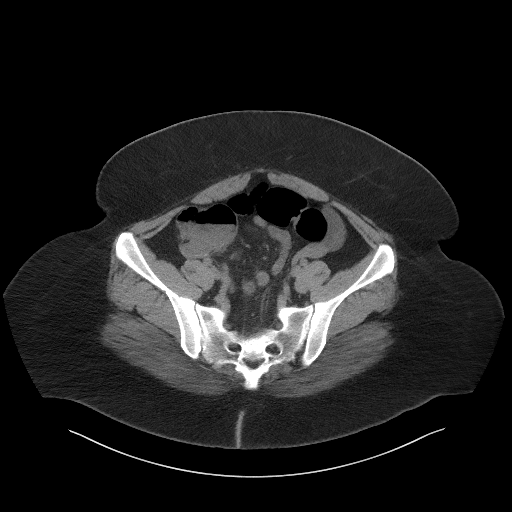
[im 44/100  soft-tissue]
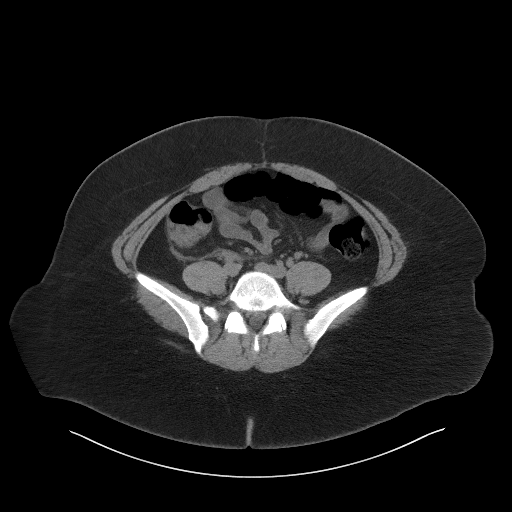
[im 52/100  soft-tissue]
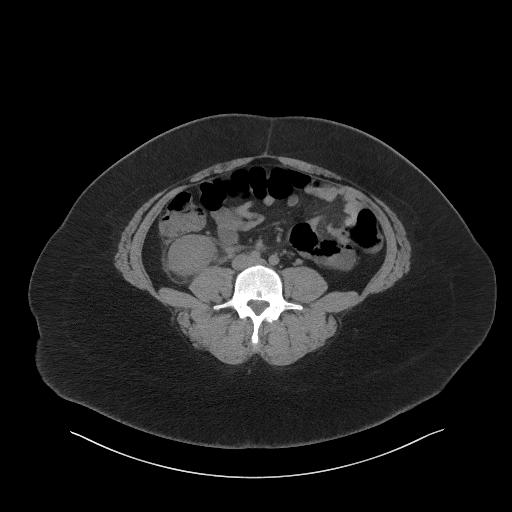
[im 56/100  soft-tissue]
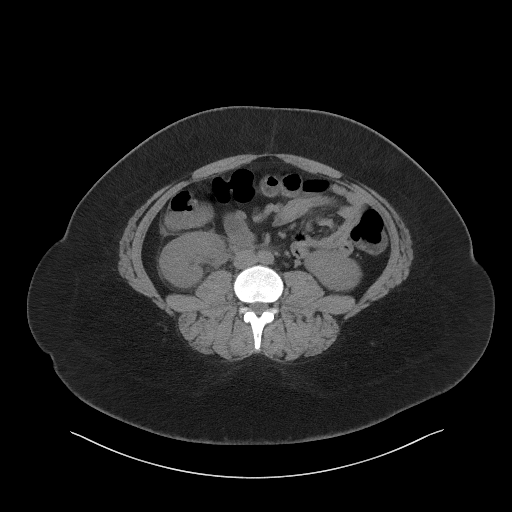
[im 64/100  soft-tissue]
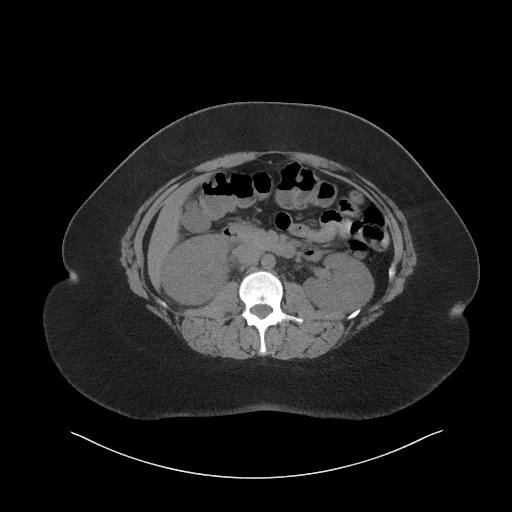
[im 64/100  bone]
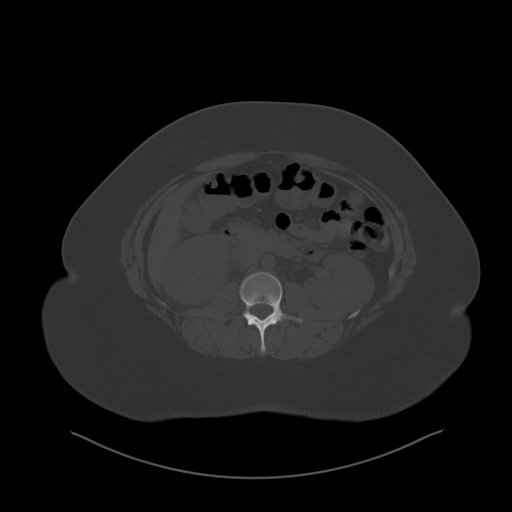
[im 72/100  soft-tissue]
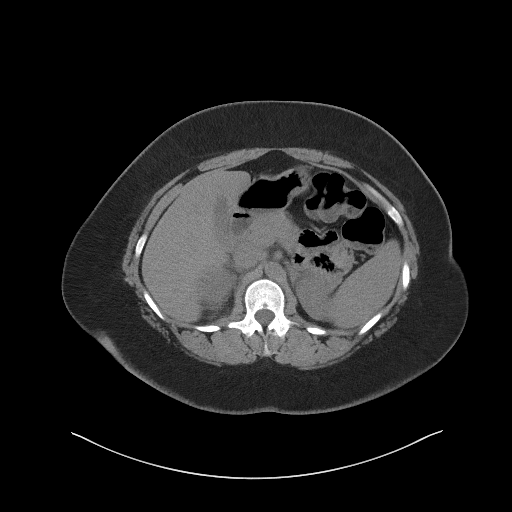
[im 80/100  soft-tissue]
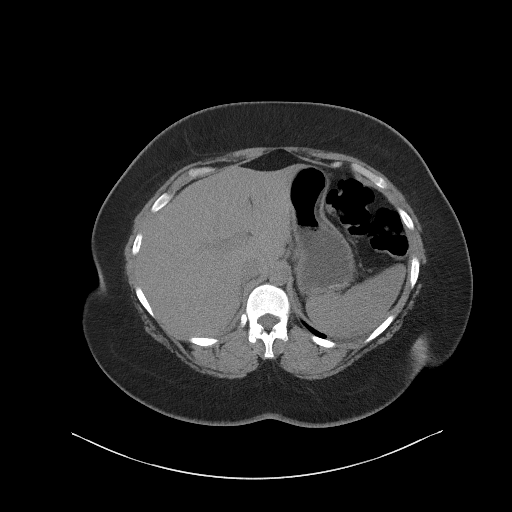
[im 88/100  soft-tissue]
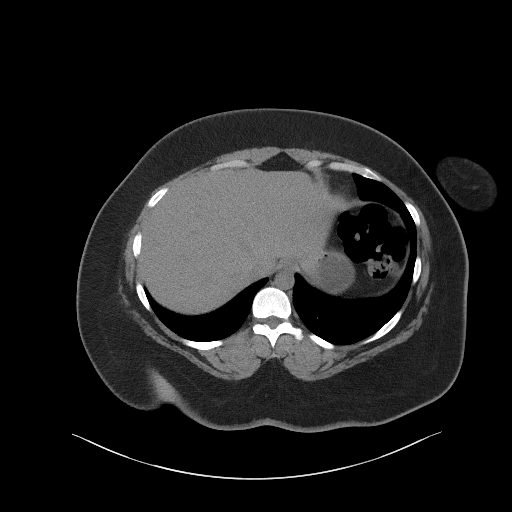
[im 96/100  soft-tissue]
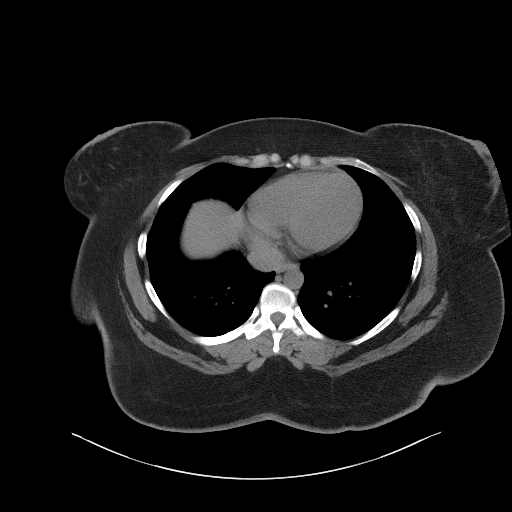

[Series 5: coronal st · coronal · 0.93mm/px · 3 of 109 slices shown]
[im 37/109  soft-tissue]
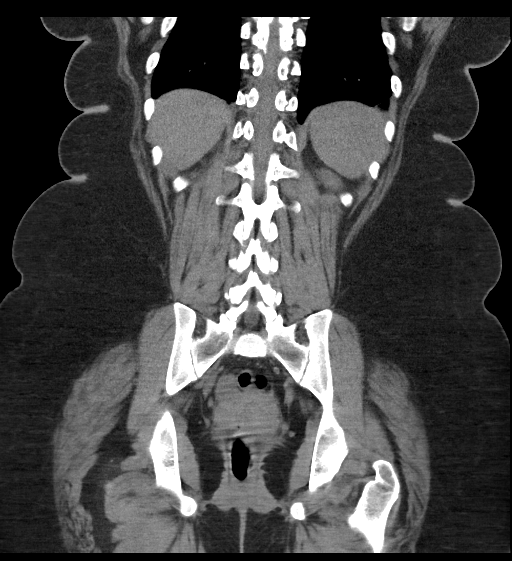
[im 49/109  soft-tissue]
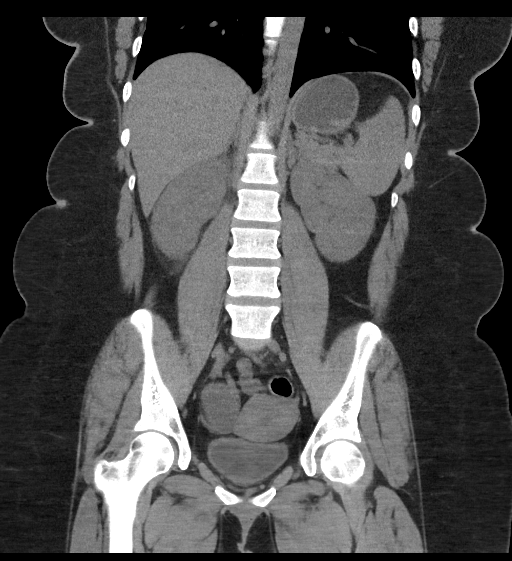
[im 61/109  soft-tissue]
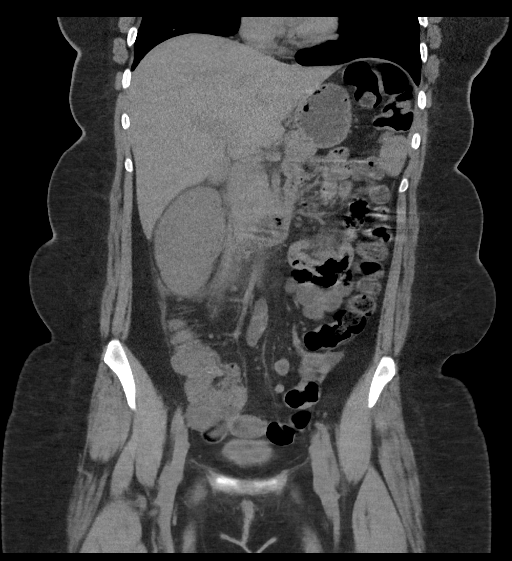

[16 of 46 positions shown; findings below may reference images not displayed]

FINDINGS: Lower chest: No acute abnormality.

Hepatobiliary: No focal liver abnormality is seen. No gallstones,
gallbladder wall thickening, or biliary dilatation.

Pancreas: Unremarkable. No pancreatic ductal dilatation or
surrounding inflammatory changes.

Spleen: Normal in size without focal abnormality.

Adrenals/Urinary Tract: Adrenal glands are unremarkable. The right
kidney appears slightly swollen and demonstrates moderate
surrounding fat stranding and perinephric edema. No renal calculi or
hydronephrosis is identified on the right. No evidence of abscess
formation. The left kidney appears normal, without renal calculi,
focal lesion, or hydronephrosis. Bladder is unremarkable.

Stomach/Bowel: Stomach is within normal limits. Appendix appears
normal. No evidence of bowel wall thickening, distention, or
inflammatory changes.

Vascular/Lymphatic: No significant vascular findings are present. No
enlarged abdominal or pelvic lymph nodes.

Reproductive: Uterus and bilateral adnexa are unremarkable.

Other: No abdominal wall hernia or abnormality. No abdominopelvic
ascites.

Musculoskeletal: No acute or significant osseous findings.
IMPRESSION: Enlargement of the right kidney with associated fat stranding is
nonspecific but may represent acute pyelonephritis.

## 2023-11-16 DIAGNOSIS — F331 Major depressive disorder, recurrent, moderate: Secondary | ICD-10-CM | POA: Diagnosis not present

## 2023-11-16 DIAGNOSIS — Z6832 Body mass index (BMI) 32.0-32.9, adult: Secondary | ICD-10-CM | POA: Diagnosis not present

## 2023-11-16 DIAGNOSIS — F411 Generalized anxiety disorder: Secondary | ICD-10-CM | POA: Diagnosis not present

## 2023-12-08 DIAGNOSIS — F411 Generalized anxiety disorder: Secondary | ICD-10-CM | POA: Diagnosis not present

## 2023-12-08 DIAGNOSIS — L309 Dermatitis, unspecified: Secondary | ICD-10-CM | POA: Diagnosis not present

## 2023-12-08 DIAGNOSIS — F331 Major depressive disorder, recurrent, moderate: Secondary | ICD-10-CM | POA: Diagnosis not present

## 2023-12-08 DIAGNOSIS — F410 Panic disorder [episodic paroxysmal anxiety] without agoraphobia: Secondary | ICD-10-CM | POA: Diagnosis not present

## 2023-12-13 ENCOUNTER — Other Ambulatory Visit (HOSPITAL_BASED_OUTPATIENT_CLINIC_OR_DEPARTMENT_OTHER): Payer: Self-pay

## 2023-12-13 MED ORDER — ZEPBOUND 12.5 MG/0.5ML ~~LOC~~ SOAJ
12.5000 mg | SUBCUTANEOUS | 2 refills | Status: AC
  Filled 2023-12-13: qty 2, 28d supply, fill #0
  Filled 2024-01-05: qty 2, 28d supply, fill #1
  Filled 2024-01-26: qty 2, 28d supply, fill #2

## 2024-01-05 ENCOUNTER — Other Ambulatory Visit (HOSPITAL_COMMUNITY): Payer: Self-pay

## 2024-01-05 ENCOUNTER — Other Ambulatory Visit (HOSPITAL_BASED_OUTPATIENT_CLINIC_OR_DEPARTMENT_OTHER): Payer: Self-pay

## 2024-01-25 ENCOUNTER — Other Ambulatory Visit (HOSPITAL_BASED_OUTPATIENT_CLINIC_OR_DEPARTMENT_OTHER): Payer: Self-pay

## 2024-01-26 ENCOUNTER — Other Ambulatory Visit (HOSPITAL_BASED_OUTPATIENT_CLINIC_OR_DEPARTMENT_OTHER): Payer: Self-pay

## 2024-02-20 ENCOUNTER — Other Ambulatory Visit (HOSPITAL_BASED_OUTPATIENT_CLINIC_OR_DEPARTMENT_OTHER): Payer: Self-pay

## 2024-02-20 MED ORDER — ZEPBOUND 15 MG/0.5ML ~~LOC~~ SOAJ
15.0000 mg | SUBCUTANEOUS | 0 refills | Status: DC
  Filled 2024-02-20: qty 2, 28d supply, fill #0

## 2024-03-15 ENCOUNTER — Other Ambulatory Visit (HOSPITAL_BASED_OUTPATIENT_CLINIC_OR_DEPARTMENT_OTHER): Payer: Self-pay

## 2024-03-15 MED ORDER — ZEPBOUND 15 MG/0.5ML ~~LOC~~ SOAJ
15.0000 mg | SUBCUTANEOUS | 1 refills | Status: DC
  Filled 2024-03-15: qty 2, 28d supply, fill #0
  Filled 2024-04-09: qty 2, 28d supply, fill #1

## 2024-03-16 ENCOUNTER — Other Ambulatory Visit (HOSPITAL_BASED_OUTPATIENT_CLINIC_OR_DEPARTMENT_OTHER): Payer: Self-pay

## 2024-04-09 ENCOUNTER — Other Ambulatory Visit (HOSPITAL_BASED_OUTPATIENT_CLINIC_OR_DEPARTMENT_OTHER): Payer: Self-pay

## 2024-05-14 ENCOUNTER — Other Ambulatory Visit (HOSPITAL_BASED_OUTPATIENT_CLINIC_OR_DEPARTMENT_OTHER): Payer: Self-pay

## 2024-05-14 ENCOUNTER — Encounter (HOSPITAL_BASED_OUTPATIENT_CLINIC_OR_DEPARTMENT_OTHER): Payer: Self-pay | Admitting: Pharmacist

## 2024-05-14 MED ORDER — ZEPBOUND 15 MG/0.5ML ~~LOC~~ SOAJ
15.0000 mg | SUBCUTANEOUS | 0 refills | Status: AC
  Filled 2024-05-14 (×3): qty 2, 28d supply, fill #0

## 2024-06-18 ENCOUNTER — Other Ambulatory Visit (HOSPITAL_BASED_OUTPATIENT_CLINIC_OR_DEPARTMENT_OTHER): Payer: Self-pay

## 2024-06-18 MED ORDER — SEMAGLUTIDE-WEIGHT MANAGEMENT 0.25 MG/0.5ML ~~LOC~~ SOAJ
0.2500 mg | SUBCUTANEOUS | 0 refills | Status: AC
  Filled 2024-06-18: qty 2, 28d supply, fill #0

## 2024-06-18 MED ORDER — WEGOVY 1 MG/0.5ML ~~LOC~~ SOAJ
1.0000 mg | SUBCUTANEOUS | 0 refills | Status: DC
  Filled 2024-06-18: qty 2, 28d supply, fill #0

## 2024-06-20 ENCOUNTER — Other Ambulatory Visit (HOSPITAL_BASED_OUTPATIENT_CLINIC_OR_DEPARTMENT_OTHER): Payer: Self-pay

## 2024-06-20 MED ORDER — SEMAGLUTIDE-WEIGHT MANAGEMENT 1 MG/0.5ML ~~LOC~~ SOAJ
1.0000 mg | SUBCUTANEOUS | 0 refills | Status: AC
  Filled 2024-06-20: qty 2, 28d supply, fill #0

## 2024-07-02 ENCOUNTER — Other Ambulatory Visit (HOSPITAL_BASED_OUTPATIENT_CLINIC_OR_DEPARTMENT_OTHER): Payer: Self-pay

## 2024-07-02 MED ORDER — SEMAGLUTIDE-WEIGHT MANAGEMENT 1.7 MG/0.75ML ~~LOC~~ SOAJ
1.7000 mg | SUBCUTANEOUS | 0 refills | Status: AC
  Filled 2024-07-02 (×2): qty 3, 28d supply, fill #0

## 2024-07-08 DIAGNOSIS — Z111 Encounter for screening for respiratory tuberculosis: Secondary | ICD-10-CM | POA: Diagnosis not present

## 2024-09-27 ENCOUNTER — Encounter (HOSPITAL_BASED_OUTPATIENT_CLINIC_OR_DEPARTMENT_OTHER): Payer: Self-pay | Admitting: Pharmacist

## 2024-09-27 ENCOUNTER — Other Ambulatory Visit (HOSPITAL_BASED_OUTPATIENT_CLINIC_OR_DEPARTMENT_OTHER): Payer: Self-pay

## 2024-09-27 MED ORDER — ZEPBOUND 2.5 MG/0.5ML ~~LOC~~ SOAJ
2.5000 mg | SUBCUTANEOUS | 0 refills | Status: AC
  Filled 2024-09-27 – 2024-10-01 (×3): qty 2, 28d supply, fill #0

## 2024-10-01 ENCOUNTER — Other Ambulatory Visit: Payer: Self-pay

## 2024-10-01 ENCOUNTER — Other Ambulatory Visit (HOSPITAL_BASED_OUTPATIENT_CLINIC_OR_DEPARTMENT_OTHER): Payer: Self-pay

## 2024-10-12 ENCOUNTER — Other Ambulatory Visit (HOSPITAL_BASED_OUTPATIENT_CLINIC_OR_DEPARTMENT_OTHER): Payer: Self-pay

## 2024-10-20 ENCOUNTER — Other Ambulatory Visit (HOSPITAL_BASED_OUTPATIENT_CLINIC_OR_DEPARTMENT_OTHER): Payer: Self-pay

## 2024-10-21 ENCOUNTER — Other Ambulatory Visit (HOSPITAL_BASED_OUTPATIENT_CLINIC_OR_DEPARTMENT_OTHER): Payer: Self-pay

## 2024-10-21 ENCOUNTER — Other Ambulatory Visit: Payer: Self-pay

## 2024-10-21 MED ORDER — ZEPBOUND 5 MG/0.5ML ~~LOC~~ SOAJ
5.0000 mg | SUBCUTANEOUS | 0 refills | Status: AC
  Filled 2024-10-21: qty 2, 28d supply, fill #0
